# Patient Record
Sex: Male | Born: 1950 | Race: White | Hispanic: No | Marital: Married | Smoking: Never smoker
Health system: Southern US, Community
[De-identification: ages and names within clinical notes are randomized; demographics above are authoritative.]

## PROBLEM LIST (undated history)

## (undated) DIAGNOSIS — K219 Gastro-esophageal reflux disease without esophagitis: Secondary | ICD-10-CM

## (undated) DIAGNOSIS — M109 Gout, unspecified: Secondary | ICD-10-CM

## (undated) DIAGNOSIS — G459 Transient cerebral ischemic attack, unspecified: Secondary | ICD-10-CM

## (undated) DIAGNOSIS — E785 Hyperlipidemia, unspecified: Secondary | ICD-10-CM

## (undated) DIAGNOSIS — M1990 Unspecified osteoarthritis, unspecified site: Secondary | ICD-10-CM

## (undated) DIAGNOSIS — R3129 Other microscopic hematuria: Secondary | ICD-10-CM

## (undated) HISTORY — DX: Other microscopic hematuria: R31.29

## (undated) HISTORY — DX: Hyperlipidemia, unspecified: E78.5

## (undated) HISTORY — DX: Gastro-esophageal reflux disease without esophagitis: K21.9

## (undated) HISTORY — DX: Unspecified osteoarthritis, unspecified site: M19.90

## (undated) HISTORY — DX: Transient cerebral ischemic attack, unspecified: G45.9

---

## 1989-01-17 HISTORY — PX: OTHER SURGICAL HISTORY: SHX169

## 2006-11-28 ENCOUNTER — Encounter: Payer: Self-pay | Admitting: Family Medicine

## 2007-12-06 ENCOUNTER — Encounter: Payer: Self-pay | Admitting: Family Medicine

## 2007-12-09 ENCOUNTER — Encounter: Payer: Self-pay | Admitting: Family Medicine

## 2008-02-01 ENCOUNTER — Encounter: Payer: Self-pay | Admitting: Family Medicine

## 2008-10-08 ENCOUNTER — Ambulatory Visit: Payer: Self-pay | Admitting: Family Medicine

## 2008-10-08 DIAGNOSIS — T148 Other injury of unspecified body region: Secondary | ICD-10-CM

## 2008-10-08 DIAGNOSIS — W57XXXA Bitten or stung by nonvenomous insect and other nonvenomous arthropods, initial encounter: Secondary | ICD-10-CM | POA: Insufficient documentation

## 2009-05-20 ENCOUNTER — Ambulatory Visit: Payer: Self-pay | Admitting: Family Medicine

## 2009-05-20 DIAGNOSIS — L2089 Other atopic dermatitis: Secondary | ICD-10-CM

## 2009-05-20 DIAGNOSIS — R21 Rash and other nonspecific skin eruption: Secondary | ICD-10-CM

## 2009-06-25 ENCOUNTER — Ambulatory Visit: Payer: Self-pay | Admitting: Family Medicine

## 2009-06-25 DIAGNOSIS — M79671 Pain in right foot: Secondary | ICD-10-CM

## 2009-06-25 DIAGNOSIS — E785 Hyperlipidemia, unspecified: Secondary | ICD-10-CM

## 2009-06-26 LAB — CONVERTED CEMR LAB
Albumin: 4.4 g/dL (ref 3.5–5.2)
Basophils Absolute: 0 10*3/uL (ref 0.0–0.1)
Basophils Relative: 1 % (ref 0–1)
CO2: 26 meq/L (ref 19–32)
Chloride: 104 meq/L (ref 96–112)
Creatinine, Ser: 1.16 mg/dL (ref 0.40–1.50)
HDL: 46 mg/dL (ref >39)
Hemoglobin: 15.3 g/dL (ref 13.0–17.0)
LDL Cholesterol: 108 mg/dL — ABNORMAL HIGH (ref 0–99)
Lymphs Abs: 1.7 10*3/uL (ref 0.7–4.0)
MCHC: 33.6 g/dL (ref 30.0–36.0)
Monocytes Relative: 11 % (ref 3–12)
Neutro Abs: 3.8 10*3/uL (ref 1.7–7.7)
Neutrophils Relative %: 61 % (ref 43–77)
PSA: 0.91 ng/mL (ref 0.10–4.00)
Platelets: 203 10*3/uL (ref 150–400)
RDW: 13.1 % (ref 11.5–15.5)
Sodium: 143 meq/L (ref 135–145)
Total Bilirubin: 0.6 mg/dL (ref 0.3–1.2)
VLDL: 12 mg/dL (ref 0–40)

## 2009-08-21 ENCOUNTER — Ambulatory Visit: Payer: Self-pay | Admitting: Family Medicine

## 2009-08-22 ENCOUNTER — Encounter: Payer: Self-pay | Admitting: Family Medicine

## 2009-08-22 LAB — CONVERTED CEMR LAB
Basophils Absolute: 0 10*3/uL (ref 0.0–0.1)
Eosinophils Relative: 1 % (ref 0–5)
HCT: 44.1 % (ref 39.0–52.0)
Hemoglobin: 15.4 g/dL (ref 13.0–17.0)
MCV: 90.9 fL (ref 78.0–100.0)
Monocytes Absolute: 1 10*3/uL (ref 0.1–1.0)

## 2009-08-23 ENCOUNTER — Encounter: Payer: Self-pay | Admitting: Family Medicine

## 2009-11-08 ENCOUNTER — Encounter: Payer: Self-pay | Admitting: Family Medicine

## 2010-05-01 ENCOUNTER — Encounter: Payer: Self-pay | Admitting: Family Medicine

## 2010-05-01 ENCOUNTER — Ambulatory Visit: Payer: Self-pay | Admitting: Family Medicine

## 2010-05-01 ENCOUNTER — Encounter
Admission: RE | Admit: 2010-05-01 | Discharge: 2010-05-01 | Payer: Self-pay | Source: Home / Self Care | Attending: Family Medicine | Admitting: Family Medicine

## 2010-05-01 DIAGNOSIS — M109 Gout, unspecified: Secondary | ICD-10-CM | POA: Insufficient documentation

## 2010-05-02 LAB — CONVERTED CEMR LAB: Uric Acid, Serum: 9.1 mg/dL — ABNORMAL HIGH (ref 4.0–7.8)

## 2010-05-06 ENCOUNTER — Encounter: Payer: Self-pay | Admitting: Family Medicine

## 2010-05-23 ENCOUNTER — Ambulatory Visit
Admission: RE | Admit: 2010-05-23 | Discharge: 2010-05-23 | Payer: Self-pay | Source: Home / Self Care | Admitting: Family Medicine

## 2010-05-23 DIAGNOSIS — S93409A Sprain of unspecified ligament of unspecified ankle, initial encounter: Secondary | ICD-10-CM | POA: Insufficient documentation

## 2010-05-24 ENCOUNTER — Encounter: Payer: Self-pay | Admitting: Family Medicine

## 2010-05-27 ENCOUNTER — Ambulatory Visit
Admission: RE | Admit: 2010-05-27 | Discharge: 2010-05-27 | Payer: Self-pay | Source: Home / Self Care | Admitting: Emergency Medicine

## 2010-05-28 ENCOUNTER — Ambulatory Visit: Admit: 2010-05-28 | Payer: Self-pay | Admitting: Family Medicine

## 2010-05-29 ENCOUNTER — Telehealth (INDEPENDENT_AMBULATORY_CARE_PROVIDER_SITE_OTHER): Payer: Self-pay | Admitting: *Deleted

## 2010-06-14 ENCOUNTER — Ambulatory Visit
Admission: RE | Admit: 2010-06-14 | Discharge: 2010-06-14 | Payer: Self-pay | Source: Home / Self Care | Attending: Family Medicine | Admitting: Family Medicine

## 2010-06-18 NOTE — Assessment & Plan Note (Signed)
Summary: NOV CPE   Vital Signs:  Patient profile:   60 year old male Height:      71 inches Weight:      214 pounds BMI:     29.95 O2 Sat:      99 % on Room air Temp:     97.7 degrees F oral Pulse rate:   56 / minute BP sitting:   134 / 82  (left arm) Cuff size:   regular  Vitals Entered By: Payton Spark CMA (June 25, 2009 9:04 AM)  O2 Flow:  Room air CC: New to est. CPE. Fasting labs   Primary Care Provider:  Seymour Bars DO  CC:  New to est. CPE. Fasting labs.  History of Present Illness: 60 yo WM presents for NOV, CPE w/ fasting labs.  Previously seen at [facility].  He has hx of L elbow pain x 3 months but never sought medical care for this.  He also has had chronic R>L foot pain with a ? podagra.  He is due for fasting labs.  His Tetanus was updated 2 yrs ago.  Colonoscopy done in 2003, was normal.    He was put on prednisone which helped his R foot pain.    Current Medications (verified): 1)  Celebrex 200 Mg Caps (Celecoxib) .... Once A Day As Needed 2)  Bayer Aspirin Ec Low Dose 81 Mg Tbec (Aspirin) .... 2 Tablets A Day 3)  Omeprazole 20 Mg Cpdr (Omeprazole) .... 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  No kids. Never smoked. Denies ETOH. Walks 30 min/ day.  Review of Systems       no fevers/sweats/weakness, unexplained wt loss/gain, no change in vision, no difficulty hearing, ringing in ears, no hay fever/allergies, no CP/discomfort, no palpitations, no breast lump/nipple discharge, no cough/wheeze, no blood in stool,no N/V/D, no nocturia, no leaking urine, no unusual vag bleeding, no  vaginal/penile discharge, no muscle/joint pain, no rash, no new/changing mole, no HA, no memory loss, no anxiety, no sleep problem, no depression, no unexplained lumps, no easy bruising/bleeding, no concern with sexual function   Physical Exam  General:  alert, well-developed, well-nourished, and well-hydrated.   Head:  /AT Eyes:  wears glasses; PERRLA Ears:  EACs patent; TMs translucent and gray with good cone of light and bony landmarks.  Nose:  no nasal discharge.   Mouth:  good dentition and pharynx pink and moist.   Neck:  no masses.  no audible carotid bruits Lungs:  Normal respiratory effort, chest expands symmetrically. Lungs are clear to auscultation, no crackles or wheezes. Heart:  normal rate, regular rhythm, no murmur, and no gallop.   Abdomen:  Bowel sounds positive,abdomen soft and non-tender without masses, organomegaly, no AA bruits Rectal:  No external abnormalities noted. Normal sphincter tone. No rectal masses or tenderness. hemoccult neg Prostate:  Prostate gland firm and smooth, no enlargement, nodularity, tenderness, mass, asymmetry or induration. Msk:  full ROM both elbows with tenderness over lateral epicondyle, L w/o edema or effusion.  dropped longitudinal arch both feet with hammertoes and a mild bunion deformity R foot.    Pulses:  2+ radial  and pedal pulses Extremities:  no LE edema Skin:  xerotic skin with papular rash in patches on the lower legs Cervical Nodes:  No lymphadenopathy noted Psych:  good eye contact, not anxious appearing, and not depressed appearing.     Impression & Recommendations:  Problem # 1:  Preventive Health Care (ICD-V70.0) Keeping healthy checklist for men reviewed. BP at goal.  BMI overwt range.  He is working on [employer] and increased exercise. Update fasting labs today.  Include ESR, Uric Acid, PSA and CBC for rash and ? gout. DRE today.  Hemoccult today.  Both normal. Tetanus due in 8 yrs. Colonoscopy due  2013.  Complete Medication List: 1)  Celebrex 200 Mg Caps (Celecoxib) .... Once a day as needed 2)  Bayer Aspirin Ec Low Dose 81 Mg Tbec (Aspirin) .... 2 tablets a day 3)  Omeprazole 20 Mg Cpdr (Omeprazole) .... Take 1 tab by mouth once daily  Other Orders: T-Uric Acid (Blood) [id number]) T-Sed Rate (Automated) [id number]) T-PSA ([id number]) T-Comprehensive Metabolic Panel [id number]) T-Lipid Profile ([id number]) T-CBC w/Diff ([id number]) Hemoccult Guaiac-1 spec.(in office) (82270) DRE (I3474)  Patient Instructions: 1)  Fasting labs today. 2)  Will call you w/ results in the morning. 3)  Checking for vasculitis, gout, signs of autoimmune processes. 4)  You may need referral to podiatrist for chronic foot pain and sports medicine for L elbow pain.

## 2010-06-18 NOTE — Assessment & Plan Note (Signed)
Summary: Rash - lower back/top of buttocks x 1-2 weeks rm 3   Vital Signs:  Patient Profile:   60 Years Old Male CC:      Rash - top of Buttocks x x 1wk Height:     72.25 inches Weight:      215 pounds O2 Sat:      100 % O2 treatment:    Room Air Temp:     97.3 degrees F oral Pulse rate:   63 / minute Pulse rhythm:   regular Resp:     16 per minute BP sitting:   129 / 73  (right arm) Cuff size:   regular  Vitals Entered By: Areta Haber CMA (May 20, 2009 1:56 PM)                  Prior Medication List:  PREDNISONE 5 MG TABS (PREDNISONE) once a day CELEBREX 200 MG CAPS (CELECOXIB) once a day as needed * PRILOSEC once a day BAYER ASPIRIN EC LOW DOSE 81 MG TBEC (ASPIRIN) 2 tablets a day   Current Allergies (reviewed today): No known allergies History of Present Illness Chief Complaint: Rash - top of Buttocks x x 1wk History of Present Illness: Rash over the top of the buttocks. Off and on for about a year but this time the rash has seen to have gotten worse. For 1-2 week this currentrash has itch and burn. Neither the lamoisil or hydrocortisone cream has seen to help.  Current Problems: RASH AND OTHER NONSPECIFIC SKIN ERUPTION (ICD-782.1) ECZEMA, ATOPIC (ICD-691.8) INSECT BITE (ICD-919.4)   Current Meds PREDNISONE 5 MG TABS (PREDNISONE) once a day CELEBREX 200 MG CAPS (CELECOXIB) once a day as needed * PRILOSEC once a day BAYER ASPIRIN EC LOW DOSE 81 MG TBEC (ASPIRIN) 2 tablets a day ELOCON 0.1 %  CREA (MOMETASONE FUROATE) apply to area 2x a day  REVIEW OF SYSTEMS Constitutional Symptoms      Denies fever, chills, and night sweats.  Eyes       Denies change in vision, eye pain, and eye discharge. Ear/Nose/Throat/Mouth       Denies hearing loss/aids, change in hearing, and ear pain.  Respiratory       Denies dry cough, productive cough, asthma, and bronchitis.  Cardiovascular       Denies murmurs and chest pain.    Gastrointestinal       Denies  stomach pain. Neurological       Denies paralysis and seizures. Musculoskeletal       Complains of joint stiffness.      Denies muscle pain, joint pain, decreased range of motion, redness, and swelling.      Comments: chronic arthritis Skin       Comments: Lower Back/top of buttocks x 1-2 weeks Psych       Denies mood changes, temper/anger issues, anxiety/stress, speech problems, and depression. Other Comments: Pt states that this rash has been off and on for 2+ months. Pt states that he has not seen his PCP for this.   Past History:  Past Medical History: Last updated: 10/08/2008 arthritis  Past Surgical History: Last updated: 10/08/2008 Denies surgical history  Family History: Last updated: 10/08/2008 mother alive age 52 and healthy father deceased age 67 heart attack sister alive age 33 and healthy  Social History: Last updated: 10/08/2008 denies drinking, smoking or recreational drug use  Family History: Reviewed history from 10/08/2008 and no changes required. mother alive age 65 and healthy father deceased  age 21 heart attack sister alive age 42 and healthy  Social History: Reviewed history from 10/08/2008 and no changes required. denies drinking, smoking or recreational drug use Physical Exam General appearance: well developed, well nourished, no acute distress Head: normocephalic, atraumatic Skin: eczema type rash upper cleavege of buttocks  MSE: oriented to time, place, and person Assessment New Problems: RASH AND OTHER NONSPECIFIC SKIN ERUPTION (ICD-782.1) ECZEMA, ATOPIC (ICD-691.8)  excema  Plan New Medications/Changes: ELOCON 0.1 %  CREA (MOMETASONE FUROATE) apply to area 2x a day  #45-60 gram x 1, 05/20/2009, Hassan Rowan MD  New Orders: New Patient Level III [phone] Planning Comments:   if not improved in 2 weeks please see dermatologisyt   The patient and/or caregiver has been counseled thoroughly with regard to medications prescribed  including dosage, schedule, interactions, rationale for use, and possible side effects and they verbalize understanding.  Diagnoses and expected course of recovery discussed and will return if not improved as expected or if the condition worsens. Patient and/or caregiver verbalized understanding.  Prescriptions: ELOCON 0.1 %  CREA (MOMETASONE FUROATE) apply to area 2x a day  #45-60 gram x 1   Entered and Authorized by:   Hassan Rowan MD   Signed by:   Hassan Rowan MD on 05/20/2009   Method used:   Printed then faxed to ...       [facility] [address] (retail)       [address]       [place], [place]  [address]       Ph: [phone]       Fax: [phone]   RxID:   [id number]   Patient Instructions: 1)  Please schedule a follow-up appointment as needed. 2)  Please schedule an appointment with Dermatologist in 2 weeks.

## 2010-06-18 NOTE — Letter (Signed)
Summary: [facility]   Imported By: Lanelle Bal 09/03/2009 08:50:25  _____________________________________________________________________  External Attachment:    Type:   Image     Comment:   External Document

## 2010-06-18 NOTE — Letter (Signed)
Summary: [facility]  [facility]   Imported By: Lanelle Bal 09/03/2009 08:52:17  _____________________________________________________________________  External Attachment:    Type:   Image     Comment:   External Document

## 2010-06-18 NOTE — Consult Note (Signed)
Summary: [facility]   Imported By: Lanelle Bal 08/28/2009 12:28:14  _____________________________________________________________________  External Attachment:    Type:   Image     Comment:   External Document

## 2010-06-18 NOTE — Miscellaneous (Signed)
Summary: old records  Clinical Lists Changes  Observations: Added new observation of PAST MED HX: arthritis GERD hx of TIA high cholesterol GERD L spine arthritis hx of microscopic hematuria  ETT 1997 (11/08/2009 8:21) Added new observation of PRIMARY MD: Seymour Bars DO (11/08/2009 8:21)       Past History:  Past Medical History: arthritis GERD hx of TIA high cholesterol GERD L spine arthritis hx of microscopic hematuria  ETT 1997

## 2010-06-18 NOTE — Assessment & Plan Note (Signed)
Summary: septic knee   Vital Signs:  Patient profile:   60 year old male Height:      71 inches Weight:      210 pounds BMI:     29.39 O2 Sat:      99 % on Room air Temp:     97.8 degrees F oral Pulse rate:   59 / minute BP sitting:   150 / 85  (left arm) Cuff size:   regular  Vitals Entered By: Payton Spark CMA (August 21, 2009 1:15 PM)  O2 Flow:  Room air CC: R knee red and swollen. ? infected.    Primary Care Provider:  Seymour Bars DO  CC:  R knee red and swollen. ? infected. Marland Kitchen  History of Present Illness: 60 yo WM presents for R knee redness x 1 day with soreness.  Has an abrasion over the patella for the past 5 days.  He has been taking Celebrex.  He has been doing labor -- knealing more often.  He has also noticed swelling that started yesterday.    He has not had a fever.  He has rigors and chills last night.  He does have a hx of gout.  He has maintained full ROM and is able to bear weight and walk on the R leg.  Current Medications (verified): 1)  Celebrex 200 Mg Caps (Celecoxib) .... Once A Day As Needed 2)  Bayer Aspirin Ec Low Dose 81 Mg Tbec (Aspirin) .... 2 Tablets A Day 3)  Omeprazole 20 Mg Cpdr (Omeprazole) .... Take 1 Tab By Mouth Once Daily  Allergies (verified): No Known Drug Allergies  Past History:  Past Medical History: Reviewed history from 06/25/2009 and no changes required. 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 Denies ETOH. Walks 30 min/ day.  Review of Systems General:  Complains of chills.  Physical Exam  General:  alert, well-developed, well-nourished, and well-hydrated.   Lungs:  Normal respiratory effort, chest expands symmetrically. Lungs are clear to auscultation, no crackles or wheezes. Heart:  Normal rate and regular rhythm. S1 and S2 normal without gallop, murmur, click, rub or other extra  sounds. Msk:  full R knee active ROM R knee effusion Skin:  color normal.  warm red R knee from superior pole of the patella to the patellar tendon with streaking redness down lateral R lower leg healing abrasion over the patella Psych:  good eye contact, not anxious appearing, and not depressed appearing.     Impression & Recommendations:  Problem # 1:  ARTHRITIS, SEPTIC (ICD-711.00)  Sent to ortho downstairs now for arthrocentesis --> send for cx and crystals. Will need empiric abx started.  Can stay on Celebrex.   Check labs today also.  Orders: T-CBC w/Diff ([id number]) T-Uric Acid (Blood) [id number])  Complete Medication List: 1)  Celebrex 200 Mg Caps (Celecoxib) .... Once a day as needed 2)  Bayer Aspirin Ec Low Dose 81 Mg Tbec (Aspirin) .... 2 tablets a day 3)  Omeprazole 20 Mg Cpdr (Omeprazole) .... Take 1 tab by mouth once daily  Patient Instructions: 1)  See Dr Jorge Mandril downstairs for septic arthrits.   2)  Need fluid sample from R knee along with empiric antibiotics.

## 2010-06-20 NOTE — Letter (Signed)
Summary: Internal Correspondence  Internal Correspondence   Imported By: Dannette Barbara 05/24/2010 12:41:23  _____________________________________________________________________  External Attachment:    Type:   Image     Comment:   External Document

## 2010-06-20 NOTE — Assessment & Plan Note (Signed)
Summary: INFLAMMATION OF FOOT (L) room 4   Vital Signs:  Patient Profile:   60 Years Old Male CC:      inflammation in LT foot Height:     71 inches Weight:      218 pounds O2 Sat:      97 % O2 treatment:    Room Air Temp:     97.5 degrees F oral Pulse rate:   93 / minute Resp:     20 per minute BP sitting:   133 / 88  (left arm) Cuff size:   large  Vitals Entered By: Clemens Catholic LPN (May 27, 2010 1:31 PM)                  Updated Prior Medication List: CELEBREX 200 MG CAPS (CELECOXIB) once a day as needed BAYER ASPIRIN EC LOW DOSE 81 MG TBEC (ASPIRIN) 2 tablets a day OMEPRAZOLE 20 MG CPDR (OMEPRAZOLE) Take 1 tab by mouth once daily ALLOPURINOL 100 MG TABS (ALLOPURINOL) 2 tabs by mouth once daily  Current Allergies (reviewed today): No known allergies History of Present Illness History from: patient Chief Complaint: inflammation in LT foot History of Present Illness: Patient recently here for L ankle sprain, that is feeling mostly better.  However now in the past few days has increased redness, swelling, itching, and pain in lateral foot and great toe of the L foot.  He has a history of gout in his R foot and R knee and has an elevated uric acid level.  He feels this is gout.  He is taking Celebrex and also has leftover Colchecine that helps.  He has not taken his Allopurinol dose today and has been on it for 1 month.  No F/C/N/V.    REVIEW OF SYSTEMS Constitutional Symptoms      Denies fever, chills, night sweats, weight loss, weight gain, and fatigue.  Eyes       Denies change in vision, eye pain, eye discharge, glasses, contact lenses, and eye surgery. Ear/Nose/Throat/Mouth       Denies hearing loss/aids, change in hearing, ear pain, ear discharge, dizziness, frequent runny nose, frequent nose bleeds, sinus problems, sore throat, hoarseness, and tooth pain or bleeding.  Respiratory       Denies dry cough, productive cough, wheezing, shortness of breath,  asthma, bronchitis, and emphysema/COPD.  Cardiovascular       Denies murmurs, chest pain, and tires easily with exhertion.    Gastrointestinal       Denies stomach pain, nausea/vomiting, diarrhea, constipation, blood in bowel movements, and indigestion. Genitourniary       Denies painful urination, kidney stones, and loss of urinary control. Neurological       Denies paralysis, seizures, and fainting/blackouts. Musculoskeletal       Complains of joint stiffness, decreased range of motion, redness, and swelling.      Denies muscle pain, joint pain, muscle weakness, and gout.  Skin       Denies bruising, unusual mles/lumps or sores, and hair/skin or nail changes.  Psych       Denies mood changes, temper/anger issues, anxiety/stress, speech problems, depression, and sleep problems. Other Comments: pt is here today for inflammation in his LT foot. he sprained his ankle and now believes his gout is flaring up. red, swollen, sensative to touch. he is taking allopurinol and Celebrex.   Past History:  Past Medical History: Reviewed history from 11/08/2009 and no changes required. arthritis GERD hx of TIA high cholesterol  GERD L spine arthritis hx of microscopic hematuria  ETT 1997  Past Surgical History: Reviewed history from 06/25/2009 and no changes required. Lipoma removed under jaw in the 1990s  Family History: Reviewed history from 06/25/2009 and no changes required. 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 Denies ETOH. Walks 30 min/ day. Physical Exam General appearance: well developed, well nourished, antalgic gait and mod distress Neurological: distal NV status intact Skin: see below MSE: oriented to time, place, and person L foot/ankle: FROM, full  strength, resisted motions not painful.  No TTP medial/lateral malleolus, navicular, base of 5th, calcaneus, Achilles, or proximal fibula. +TTP 1st MTP and along 4th and 5th MT.  Mild TTP ATFL.  + swelling entire foot with 1+ pitting edema.  No ecchymoses. +warmth  Patient Education: Patient and/or caregiver instructed in the following: rest, fluids.  Plan New Medications/Changes: PREDNISONE (PAK) 10 MG TABS (PREDNISONE) 12 day pack, use as directed  #1 x 0, 05/27/2010, Hoyt Koch MD KEFLEX 500 MG CAPS (CEPHALEXIN) three times a day for 7 days  #21 x 0, 05/27/2010, Hoyt Koch MD  New Orders: Est. Patient Level III [phone] Planning Comments:   L ankle sprain last week, now currently with a probable gout flare.  Will give Rx for Pred 12 day pack.  Also gave Rx for Keflex 500 three times a day for 7 days to hold since I can't completely rule out septic process (although I doubt).  Take if fever or not improving with prednisone.  Encourage ice, elevation, compression stocking, and low-purine diet.  Hold on Allopurinol until flare is subsiding.  May continue Celebrex once daily. Caution with stomach upset.  If not improving, follow up with PCP, rheum, or ortho foot.    The patient and/or caregiver has been counseled thoroughly with regard to medications prescribed including dosage, schedule, interactions, rationale for use, and possible side effects and they verbalize understanding.  Diagnoses and expected course of recovery discussed and will return if not improved as expected or if the condition worsens. Patient and/or caregiver verbalized understanding.  Prescriptions: PREDNISONE (PAK) 10 MG TABS (PREDNISONE) 12 day pack, use as directed  #1 x 0   Entered and Authorized by:   Hoyt Koch MD   Signed by:   Hoyt Koch MD on 05/27/2010   Method used:   Handwritten   RxID:   [id number] KEFLEX 500 MG CAPS (CEPHALEXIN) three times a day for 7 days  #21 x 0   Entered  and Authorized by:   Hoyt Koch MD   Signed by:   Hoyt Koch MD on 05/27/2010   Method used:   Handwritten   RxID:   [id number]   Orders Added: 1)  Est. Patient Level III [53664]

## 2010-06-20 NOTE — Assessment & Plan Note (Signed)
Summary: ankle f/u   Vital Signs:  Patient profile:   60 year old male Height:      71 inches Weight:      217 pounds BMI:     30.37 O2 Sat:      99 % on Room air Pulse rate:   69 / minute BP sitting:   114 / 81  (left arm) Cuff size:   large  Vitals Entered By: Payton Spark CMA (June 14, 2010 8:25 AM)  O2 Flow:  Room air   Primary Care Provider:  Seymour Bars DO   History of Present Illness: 60 yo WM presents for an ankle sprain in the L ankle on 1-3.  He was seen at [facility] for pain and swelling.  He did take some prednisone which did hlep.  He tried to start walking again but had soreness the following day.  He was wrapping it at the onset but not anymore and still has some mild swelling over the lateral malleolus.    He would like to resume exercise but still has some pain.    Current Medications (verified): 1)  Celebrex 200 Mg Caps (Celecoxib) .... Once A Day As Needed 2)  Bayer Aspirin Ec Low Dose 81 Mg Tbec (Aspirin) .... 2 Tablets A Day 3)  Omeprazole 20 Mg Cpdr (Omeprazole) .... Take 1 Tab By Mouth Once Daily 4)  Allopurinol 100 Mg Tabs (Allopurinol) .... 2 Tabs By Mouth Once Daily  Allergies (verified): No Known Drug Allergies  Past History:  Past Medical History: Reviewed history from 11/08/2009 and no changes required. 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 Walks 30 min/ day.  Review of Systems      See HPI  Physical Exam  General:  alert, well-developed, well-nourished, and well-hydrated.   Msk:  mild edema localzed to the lateral L malleoulus.  no bruising or redness.  neg ant drawer test and talar tilt test.  point tender over the posterior talofibular ligament.   Pulses:  2+ pedal pulses Extremities:  no LE  edema Neurologic:  gait normal, sensation intact to light touch.     Impression & Recommendations:  Problem # 1:  ANKLE SPRAIN, LEFT (ICD-845.00) Improving.  Likely had a partial tear of the PTF ligament which may take 2 more weeks to heal.  advised use of elastic brace while exercising and slowly returning over the next 2 wks.  Ice for 10 min after exercise.  NSAIDs as needed.  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 His updated medication list for this problem includes:    Celebrex 200 Mg Caps (Celecoxib) ..... Once a day as needed    Bayer Aspirin Ec Low Dose 81 Mg Tbec (Aspirin) .Marland Kitchen... 2 tablets a day  Complete Medication List: 1)  Celebrex 200 Mg Caps (Celecoxib) .... Once a day as needed 2)  Bayer Aspirin Ec Low Dose 81 Mg Tbec (Aspirin) .... 2 tablets a day 3)  Omeprazole 20 Mg Cpdr (Omeprazole) .... Take 1 tab by mouth once daily 4)  Allopurinol 100 Mg Tabs (Allopurinol) .... 2 tabs by mouth once daily  Other Orders: T-Comprehensive Metabolic Panel [id number]) T-Lipid Profile [id number]) T-PSA ([id number]) T-Uric Acid (Blood) ([id number])  Patient Instructions: 1)  Update fasting after 2/7 downstairs. 2)  Schedule your PHYSICAL in  6 wks. 3)  Use elastic ankle brace with exercise for the next 2 wks. 4)  Ice ankle after walking x 10 min. 5)  Call if you want to add PT.   Orders Added: 1)  T-Comprehensive Metabolic Panel [[id number]] 2)  T-Lipid Profile [[id number]] 3)  T-PSA [[id number]] 4)  T-Uric Acid (Blood) [[id number]] 5)  Est. Patient Level III [09811]

## 2010-06-20 NOTE — Assessment & Plan Note (Signed)
Summary: gout/ elbow pain   Vital Signs:  Patient profile:   60 year old male Height:      71 inches Weight:      218 pounds Pulse rate:   60 / minute BP sitting:   127 / 85  (right arm) Cuff size:   regular  Vitals Entered By: Avon Gully CMA, Duncan Dull) (May 01, 2010 1:30 PM) CC: left elbow pain on and off x several years   Primary Care Provider:  Seymour Bars DO  CC:  left elbow pain on and off x several years.  History of Present Illness: 60 yo WM presents for L elbow pain and off for years.  He has a hx of gout and went to the hospital for gout in his knee this year with secondary cellulitis.  Has mild swelling over the L olecranon but no redness or heat.   This did start to get worse after he started working out a few wks ago and has the most pain with bench pressing.  Denies prevoius injury or any pain in the R elbow.  Has some mild resting pain.  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    Current Medications (verified): 1)  Celebrex 200 Mg Caps (Celecoxib) .... Once A Day As Needed 2)  Bayer Aspirin Ec Low Dose 81 Mg Tbec (Aspirin) .... 2 Tablets A Day 3)  Omeprazole 20 Mg Cpdr (Omeprazole) .... Take 1 Tab By Mouth Once Daily  Allergies (verified): No Known Drug Allergies  Comments:  Nurse/Medical Assistant: The patient's medications and allergies were reviewed with the patient and were updated in the Medication and Allergy Lists. Avon Gully CMA, Duncan Dull) (May 01, 2010 1:31 PM)  Past History:  Past Medical History: Reviewed history from 11/08/2009 and no changes required. 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   No kids. Never smoked. Denies ETOH. Walks 30 min/ day.  Review of Systems      See HPI  Physical Exam  General:  alert, well-developed, well-nourished, and well-hydrated.   Msk:  mild inflammation over the L olecranon bursa w/o redness or heat.  full active L elbow ROM with no resisting pain.   Pulses:  2+ radial pulses Extremities:  no hand/ leg edmea Skin:  color normal.   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  Will get an Xray and a uric acid level today.  Continue daily Celebrex.  Use of OTC brace has not helped.  Can use ice 15 min 3 x a day for pain.  Plan to start Allopurinol to get Uric Acid < 6 if indicated and offered a f/u with Dr Jorge Mandril for injection. Orders: T-DG Elbow 2 Views*L* ([address])  Problem # 2:  GOUT, UNSPECIFIED (ICD-274.9) as per #1. His updated medication list for this problem includes:    Celebrex 200 Mg Caps (Celecoxib) ..... Once a day as needed  Orders: T-Uric Acid (Blood) ([id number])  Complete Medication List: 1)  Celebrex 200 Mg Caps (Celecoxib) .... Once a day as needed 2)  Bayer Aspirin Ec Low Dose  81 Mg Tbec (Aspirin) .... 2 tablets a day 3)  Omeprazole 20 Mg Cpdr (Omeprazole) .... Take 1 tab by mouth once daily  Patient Instructions: 1)  Stay on Prilosec 20 mg daily + Celebrex 200 mg daily. 2)  Check uric Acid level today. 3)  Start Allopurinol if > 6. 4)  L elbow Xray today. 5)  Consider injection with Dr Jorge Mandril downstairs. Prescriptions: CELEBREX 200 MG CAPS (CELECOXIB) once a day as needed  #90 x 1   Entered and Authorized by:   Seymour Bars DO   Signed by:   Seymour Bars DO on 05/01/2010   Method used:   Printed then faxed to ...       [facility] (mail-order)       [address] [place], [place]  [address]       Ph: [phone]       Fax: [phone]   RxID:   [id number] OMEPRAZOLE 20 MG CPDR  (OMEPRAZOLE) Take 1 tab by mouth once daily  #90 x 3   Entered and Authorized by:   Seymour Bars DO   Signed by:   Seymour Bars DO on 05/01/2010   Method used:   Printed then faxed to ...       [facility] (mail-order)       [address] [place], [place]  [address]       Ph: [phone]       Fax: [phone]   RxID:   [id number]    Orders Added: 1)  T-DG Elbow 2 Views*L* [73070] 2)  T-Uric Acid (Blood) [84550-23180] 3)  Est. Patient Level III [88416]

## 2010-06-20 NOTE — Medication Information (Signed)
Summary: Fax Regarding NSAID Induced Ulcers/[facility]  Fax Regarding NSAID Induced Ulcers/[facility]   Imported By: Lanelle Bal 05/17/2010 11:51:27  _____________________________________________________________________  External Attachment:    Type:   Image     Comment:   External Document

## 2010-06-20 NOTE — Assessment & Plan Note (Signed)
Summary: LEFT ANKLE PAIN AND SWELLING (rm3)   Vital Signs:  Patient Profile:   60 Years Old Male CC:      left ankle pain x 2 days Height:     71 inches Weight:      220 pounds O2 Sat:      99 % O2 treatment:    Room Air Temp:     97.6 degrees F oral Pulse rate:   69 / minute Resp:     14 per minute BP sitting:   134 / 84  (left arm) Cuff size:   large  Pt. in pain?   yes    Location:   left ankle    Intensity:   4    Type:       aching  Vitals Entered By: Lajean Saver RN (May 23, 2010 5:17 PM)                   Updated Prior Medication List: CELEBREX 200 MG CAPS (CELECOXIB) once a day as needed BAYER ASPIRIN EC LOW DOSE 81 MG TBEC (ASPIRIN) 2 tablets a day OMEPRAZOLE 20 MG CPDR (OMEPRAZOLE) Take 1 tab by mouth once daily ALLOPURINOL 100 MG TABS (ALLOPURINOL) 2 tabs by mouth once daily  Current Allergies: No known allergies History of Present Illness Chief Complaint: left ankle pain x 2 days History of Present Illness:  Subjective:  Patient complains of inverting his left ankle two days ago.  He had immediate swelling and pain laterally that has persisted.  Has pain with ambulation  REVIEW OF SYSTEMS Constitutional Symptoms      Denies fever, chills, night sweats, weight loss, weight gain, and fatigue.  Eyes       Denies change in vision, eye pain, eye discharge, glasses, contact lenses, and eye surgery. Ear/Nose/Throat/Mouth       Denies hearing loss/aids, change in hearing, ear pain, ear discharge, dizziness, frequent runny nose, frequent nose bleeds, sinus problems, sore throat, hoarseness, and tooth pain or bleeding.  Respiratory       Denies dry cough, productive cough, wheezing, shortness of breath, asthma, bronchitis, and emphysema/COPD.  Cardiovascular       Denies murmurs, chest pain, and tires easily with exhertion.    Gastrointestinal       Denies stomach pain, nausea/vomiting, diarrhea, constipation, blood in bowel movements, and  indigestion. Genitourniary       Denies painful urination, kidney stones, and loss of urinary control. Neurological       Denies paralysis, seizures, and fainting/blackouts. Musculoskeletal       Complains of muscle pain, joint pain, decreased range of motion, redness, swelling, and gout.      Denies joint stiffness and muscle weakness.      Comments: left ankle Skin       Denies bruising, unusual mles/lumps or sores, and hair/skin or nail changes.  Psych       Denies mood changes, temper/anger issues, anxiety/stress, speech problems, depression, and sleep problems. Other Comments: Patient rolled his left ankle 2 days ago when stepping off of a curb. Swelling and bruising are present. The swelling is extended into his lower leg   Past History:  Past Medical History: Reviewed history from 11/08/2009 and no changes required. arthritis GERD hx of TIA high cholesterol GERD L spine arthritis hx of microscopic hematuria  ETT 1997  Past Surgical History: Reviewed history from 06/25/2009 and no changes required. Lipoma removed under jaw in the 1990s  Family History: Reviewed history  from 06/25/2009 and no changes required. 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 Denies ETOH. Walks 30 min/ day.   Objective:  Appearance:  Patient appears healthy, stated age, and in no acute distress  Left ankle:  Decreased range of motion.  Tenderness and swelling over both medial and lateral malleoli.  Joint stable.  No tenderness over the base of the fifth  metatarsal.  Distal neurovascular intact.  Left ankle X-ray:  No acute osseous abnormality.  Bilateral malleolar soft tissue swelling could indicate ligamentous injury. Assessment New Problems: ANKLE SPRAIN, LEFT  (ICD-845.00)   Plan New Orders: T-DG Ankle Complete*L* [73610] Ace Wraps 3-5 in/yard  [A6449] Crutches [E0110] Crutches fitting and training [97760] Aircast Ankle Brace [L4350] Est. Patient Level III [[address]] Planning Comments:     Apply ice pack for 30 minutes every 1 to 2 hours today and tomorrow.  Elevate.  Use crutches for 3 to 5 days.  Wear Ace wrap until swelling decreases.  Wear brace for about 2 to 3 weeks.  Begin exercises in about 5 days as per instruction sheet ([employer] information and instruction patient handout given)   Continue Celebrex Follow-up with sports med clinic if not improving 2 weeks.   The patient and/or caregiver has been counseled thoroughly with regard to medications prescribed including dosage, schedule, interactions, rationale for use, and possible side effects and they verbalize understanding.  Diagnoses and expected course of recovery discussed and will return if not improved as expected or if the condition worsens. Patient and/or caregiver verbalized understanding.   Orders Added: 1)  T-DG Ankle Complete*L* [73610] 2)  Ace Wraps 3-5 in/yard  [A6449] 3)  Crutches [E0110] 4)  Crutches fitting and training [97760] 5)  Aircast Ankle Brace [L4350] 6)  Est. Patient Level III [60[address]]

## 2010-06-20 NOTE — Progress Notes (Signed)
  Phone Note Outgoing Call Call back at [facility] Phone [phone]   Call placed by: Lajean Saver RN,  May 29, 2010 2:40 PM Call placed to: Patient Summary of Call: Callback: No answer. Message left with reason for call and call back with any questions or concerns

## 2010-07-02 ENCOUNTER — Encounter: Payer: Self-pay | Admitting: Family Medicine

## 2010-07-05 ENCOUNTER — Encounter: Payer: Self-pay | Admitting: Family Medicine

## 2010-07-08 LAB — CONVERTED CEMR LAB
Albumin: 4.4 g/dL (ref 3.5–5.2)
CO2: 27 meq/L (ref 19–32)
Cholesterol: 230 mg/dL — ABNORMAL HIGH (ref 0–200)
Glucose, Bld: 93 mg/dL (ref 70–99)
HDL: 44 mg/dL (ref >39)
LDL Cholesterol: 167 mg/dL — ABNORMAL HIGH (ref 0–99)
Sodium: 138 meq/L (ref 135–145)
Uric Acid, Serum: 7.7 mg/dL (ref 4.0–7.8)
VLDL: 19 mg/dL (ref 0–40)

## 2010-07-29 ENCOUNTER — Encounter (INDEPENDENT_AMBULATORY_CARE_PROVIDER_SITE_OTHER): Payer: BC Managed Care – PPO | Admitting: Family Medicine

## 2010-07-29 ENCOUNTER — Encounter: Payer: Self-pay | Admitting: Family Medicine

## 2010-07-29 DIAGNOSIS — M25529 Pain in unspecified elbow: Secondary | ICD-10-CM | POA: Insufficient documentation

## 2010-07-29 DIAGNOSIS — Z0000 Encounter for general adult medical examination without abnormal findings: Secondary | ICD-10-CM

## 2010-08-06 NOTE — Assessment & Plan Note (Signed)
Summary: CPE   Vital Signs:  Patient profile:   60 year old male Height:      71 inches Weight:      217 pounds BMI:     30.37 O2 Sat:      100 % on Room air Pulse rate:   61 / minute BP sitting:   142 / 88  (left arm) Cuff size:   large  Vitals Entered By: Payton Spark CMA (July 29, 2010 3:04 PM)  O2 Flow:  Room air CC: CPE   Primary Care Provider:  Seymour Bars DO  CC:  CPE.  History of Present Illness: 60 yo WM presents for CPE.  He is a previously healthy male, married with no children.  Hx of high cholesterol, recently started on cRestor and Gout - doing well on allopurinol but has a hard time remembering it everyday.  He continues to have L elbow pain x 3 months that is limiting his ability to work out.  Had a normal xrays 3 mos ago and has used a brace and celebrex and has used relative rest but nothing has helped.  He just had labs done in Feb. His colonoscopy was done 7-8 yrs ago. PSA was normal last month with fasting labs.  Denies fam hx of premature heart dz or problems with chest pain or DOE.  Current Medications (verified): 1)  Celebrex 200 Mg Caps (Celecoxib) .... Once A Day As Needed 2)  Bayer Aspirin Ec Low Dose 81 Mg Tbec (Aspirin) .... 2 Tablets A Day 3)  Omeprazole 20 Mg Cpdr (Omeprazole) .... Take 1 Tab By Mouth Once Daily 4)  Allopurinol 100 Mg Tabs (Allopurinol) .... 2 Tabs By Mouth Once Daily 5)  Crestor 10 Mg Tabs (Rosuvastatin Calcium) .Marland Kitchen.. 1 Tab By Mouth At Bedtime  Allergies (verified): No Known Drug Allergies  Past History:  Past Medical History: Reviewed history from 11/08/2009 and no changes required. arthritis GERD hx of TIA high cholesterol GERD L spine arthritis hx of microscopic hematuria  ETT 1997  Family History: Reviewed history from 06/25/2009 and no changes required. 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 Denies ETOH. Walks 30 min/ day.  Review of Systems  The patient denies anorexia, fever, weight loss, weight gain, vision loss, decreased hearing, hoarseness, chest pain, syncope, dyspnea on exertion, peripheral edema, prolonged cough, headaches, hemoptysis, abdominal pain, melena, hematochezia, severe indigestion/heartburn, hematuria, incontinence, genital sores, muscle weakness, suspicious skin lesions, transient blindness, difficulty walking, depression, unusual weight change, abnormal bleeding, enlarged lymph nodes, angioedema, breast masses, and testicular masses.    Physical Exam  General:  alert, well-developed, well-nourished, and well-hydrated.   Head:  normocephalic, atraumatic, and male-pattern balding.   Eyes:  pupils equal, pupils round, and pupils reactive to light.   Ears:  EACs patent; TMs translucent and gray with good cone of light and bony landmarks.  Nose:  no nasal discharge.   Mouth:  good dentition and pharynx pink and moist.   Neck:  supple and no masses.  no audible carotid bruits Lungs:  Normal respiratory effort, chest expands symmetrically. Lungs are clear to auscultation, no crackles or wheezes. Heart:  Normal rate and regular rhythm. S1 and S2 normal without gallop, murmur, click, rub or other extra sounds. Abdomen:  soft, non-tender, normal bowel sounds,  no distention, no masses, no guarding, no rigidity, no hepatomegaly, and no splenomegaly.  no AA bruits Rectal:  hemoccult neg, no external abnormalities.   Prostate:  Prostate gland firm and smooth, 1+ enlargement, nodularity, tenderness, mass, asymmetry or induration. Msk:  tender over medial L olecranonon with full rOM Pulses:  2+ radial and pedal pulses Extremities:  no UE or LE edema Neurologic:  gait normal.   Skin:  color normal.  fair skinned Cervical Nodes:  No lymphadenopathy  noted Psych:  good eye contact, not anxious appearing, and not depressed appearing.     Impression & Recommendations:  Problem # 1:  HEALTH MAINTENANCE EXAM (ICD-V70.0) Keeping healthy checklist for men reviewed. BP high today, previously at goal.\ BMI 30 = class I obesity. Ortho referral made for ongoing L elbow pain. Labs updated 06-2010.  Immunizations UTD. Recheck cholesterol/ uric acid in 1 month. Colonoscopy due in 1-2 yrs. DRE today; PSA with labs. Continue to work on [employer], regular exercise, MVI and ASA 81 m/g day.  Complete Medication List: 1)  Celebrex 200 Mg Caps (Celecoxib) .... Once a day as needed 2)  Bayer Aspirin Ec Low Dose 81 Mg Tbec (Aspirin) .... 2 tablets a day 3)  Omeprazole 20 Mg Cpdr (Omeprazole) .... Take 1 tab by mouth once daily 4)  Allopurinol 100 Mg Tabs (Allopurinol) .... 2 tabs by mouth once daily 5)  Crestor 10 Mg Tabs (Rosuvastatin calcium) .Marland Kitchen.. 1 tab by mouth at bedtime  Other Orders: Orthopedic Surgeon Referral (Ortho Surgeon) T-LDL Direct [id number]) T-Liver Profile [phone]) T-Uric Acid (Blood) [phone])  Patient Instructions: 1)  Will get you in with Dr August Saucer for L elbow pain, possible partial tendon rupture. 2)  Stay on crestor and update labs in 1 month-- order printed.  can be done nonfasting. 3)  REturn for f/u as needed or in 1 yr for next physical. Prescriptions: ALLOPURINOL 100 MG TABS (ALLOPURINOL) 2 tabs by mouth once daily  #60 x 3   Entered and Authorized by:   Seymour Bars DO   Signed by:   Seymour Bars DO on 07/29/2010   Method used:   Print then Give to Patient   RxID:   [id number]    Orders Added: 1)  Orthopedic Surgeon Referral [Ortho Surgeon] 2)  T-LDL Direct [[id number]] 3)  T-Liver Profile [[id number]] 4)  T-Uric Acid (Blood) [[id number]] 5)  Est. Patient age 12-64 [phone]

## 2010-08-07 ENCOUNTER — Encounter: Payer: Self-pay | Admitting: Family Medicine

## 2010-08-08 ENCOUNTER — Ambulatory Visit (INDEPENDENT_AMBULATORY_CARE_PROVIDER_SITE_OTHER): Payer: BC Managed Care – PPO | Admitting: Family Medicine

## 2010-08-08 VITALS — BP 124/83 | HR 61 | Ht 72.0 in | Wt 219.0 lb

## 2010-08-08 DIAGNOSIS — M79671 Pain in right foot: Secondary | ICD-10-CM

## 2010-08-08 DIAGNOSIS — M79609 Pain in unspecified limb: Secondary | ICD-10-CM

## 2010-08-08 LAB — CBC WITH DIFFERENTIAL/PLATELET
Lymphocytes Relative: 42 % (ref 12–46)
Lymphs Abs: 2.7 10*3/uL (ref 0.7–4.0)
Neutro Abs: 3.2 10*3/uL (ref 1.7–7.7)
RBC: 4.68 MIL/uL (ref 4.22–5.81)
WBC: 6.5 10*3/uL (ref 4.0–10.5)

## 2010-08-08 NOTE — Assessment & Plan Note (Signed)
New onset R foot pain w/o trauma with localized redness and pain over the dorsalis pedis artery.  Will obtain an arterial doppler ultrasound to r/o arterial clot today and will get an ESR and CBC with diff today.  Unlikely gout or osteoarthritis since this is not located over a joint.  Consider dx of metatarsal stress fracture.  If everything comes back normal, will proceed with an XRAY tomorrow.  Can use ice, NSAIDS and vicodin for now and limit walking.

## 2010-08-08 NOTE — Progress Notes (Signed)
  Subjective:    Patient ID: CHRYSTOPHER STANGL, male    DOB: 04-May-1951, 60 y.o.   MRN: [id number]  HPI 60 yo WM presents for a R dorsal foot pain with localized redness for the past 2 days w/o trauma.  He has been walking more on the treadmill.  He thought it was just his arthritis but this is not located over a joint.  Denies bruising or swelling.  Denies tingling or numbness in the toes.  Has pain with sitting and ambulating.  He is limping.  Getting a little relief from Celebrex and using some vicodin for pain.  Denies fevers or chills.      Review of Systems as per HPI     Objective:   Physical Exam  Constitutional: He appears well-developed and well-nourished.  Cardiovascular: Normal rate, regular rhythm, normal heart sounds and intact distal pulses.   No murmur heard. Pulmonary/Chest: Effort normal and breath sounds normal.  Musculoskeletal: He exhibits tenderness. He exhibits no edema.  Neurological:       Antalgic gait  Skin: Skin is warm and dry.             Assessment & Plan:

## 2010-08-08 NOTE — Patient Instructions (Signed)
Will get labs and an u/s today. Will call you w/ results tomorrow.

## 2010-08-12 ENCOUNTER — Telehealth: Payer: Self-pay | Admitting: Family Medicine

## 2010-08-12 NOTE — Telephone Encounter (Signed)
Pt aware of the above  

## 2010-08-12 NOTE — Telephone Encounter (Signed)
Michelle, pls call pt and let him know that his lower extremity arterial u/s came back normal.  No sign of arterial blood clot.  Continue ice and anti inflammatories and let me know if redness and pain has not resolved by the end of this week.

## 2010-10-02 ENCOUNTER — Encounter: Payer: Self-pay | Admitting: Emergency Medicine

## 2010-10-02 ENCOUNTER — Inpatient Hospital Stay (INDEPENDENT_AMBULATORY_CARE_PROVIDER_SITE_OTHER)
Admission: RE | Admit: 2010-10-02 | Discharge: 2010-10-02 | Disposition: A | Discharge status: Home / Self Care | Payer: BC Managed Care – PPO | Source: Ambulatory Visit | Attending: Emergency Medicine | Admitting: Emergency Medicine

## 2010-10-02 DIAGNOSIS — M7750 Other enthesopathy of unspecified foot: Secondary | ICD-10-CM

## 2010-10-02 DIAGNOSIS — M109 Gout, unspecified: Secondary | ICD-10-CM

## 2010-11-18 ENCOUNTER — Other Ambulatory Visit: Payer: Self-pay | Admitting: *Deleted

## 2010-11-18 MED ORDER — CELECOXIB 200 MG PO CAPS: 200.0000 mg | ORAL_CAPSULE | Freq: Every day | ORAL | Status: DC | PRN

## 2010-11-26 ENCOUNTER — Encounter: Payer: Self-pay | Admitting: Family Medicine

## 2010-11-26 ENCOUNTER — Ambulatory Visit (INDEPENDENT_AMBULATORY_CARE_PROVIDER_SITE_OTHER): Payer: BC Managed Care – PPO | Admitting: Family Medicine

## 2010-11-26 DIAGNOSIS — E785 Hyperlipidemia, unspecified: Secondary | ICD-10-CM

## 2010-11-26 DIAGNOSIS — M25529 Pain in unspecified elbow: Secondary | ICD-10-CM

## 2010-11-26 DIAGNOSIS — M109 Gout, unspecified: Secondary | ICD-10-CM

## 2010-11-26 MED ORDER — CELECOXIB 200 MG PO CAPS: 200.0000 mg | ORAL_CAPSULE | Freq: Every day | ORAL | Status: DC | PRN

## 2010-11-26 MED ORDER — OMEPRAZOLE 20 MG PO CPDR: 20.0000 mg | DELAYED_RELEASE_CAPSULE | Freq: Every day | ORAL | Status: AC

## 2010-11-26 MED ORDER — LOVASTATIN 40 MG PO TABS: 40.0000 mg | ORAL_TABLET | Freq: Every day | ORAL | Status: DC

## 2010-11-26 NOTE — Assessment & Plan Note (Signed)
Likely the cause for his chronic foot pain but he declined to go back on allopurinol at this point.  He is agreeable to seeing podiatrist and I think this is a good idea to see if he needs to be back on medication and to get him back to regular exercise.  I did RF his Celebrex today.

## 2010-11-26 NOTE — Assessment & Plan Note (Signed)
Did well on CRestor but has come off of it and it's not covered by insurance.  Reviewed his last set of labs from [place].  Will change him to Lovastatin and recheck labs in 3 mos.

## 2010-11-26 NOTE — Assessment & Plan Note (Signed)
hmproved with rest and after seeing ortho.  He wants to start lifting weights again.  I suggest he see sports medicine to see if there is specific rehab or injection that can be tried.

## 2010-11-26 NOTE — Progress Notes (Signed)
  Subjective:    Patient ID: Raymond Stark, male    DOB: 1950/10/18, 60 y.o.   MRN: [id number]  HPI  60 yo WM presents for f/u gout -- off meds, f/u cholesterol -- ran out of Crestor samples months ago and continued L elbow pain after seeing the orthopedist in [place].  He would really like to get back to exercise, noting that he feels deconditioned and is eating more junk food now.  He is limited with weight lifting due to L elbow pain and limited with extensive walking / running due to bilat foot pain, worsened since he's had gout.  He needs RF of Celebrex.    BP 125/79  Pulse 60  Ht 6' (1.829 m)  Wt 215 lb (97.523 kg)  BMI 29.16 kg/m2  SpO2 98%   Review of Systems  Constitutional: Negative for fatigue.  Respiratory: Negative for chest tightness and shortness of breath.   Cardiovascular: Negative for chest pain, palpitations and leg swelling.  Musculoskeletal: Negative for joint swelling and gait problem.  Psychiatric/Behavioral: Negative for dysphoric mood.       Objective:   Physical Exam  Constitutional: He appears well-developed and well-nourished. No distress.  HENT:  Mouth/Throat: Oropharynx is clear and moist.  Neck: Neck supple.  Cardiovascular: Normal rate, regular rhythm and normal heart sounds.   Pulmonary/Chest: Effort normal and breath sounds normal.  Musculoskeletal: He exhibits no edema.  Skin: Skin is warm and dry.  Psychiatric: He has a normal mood and affect.          Assessment & Plan:

## 2010-11-26 NOTE — Patient Instructions (Addendum)
Meds RFd.    Change cholesterol medicine to Lovastatin at bedtime.    Stay off Allopurinol.  F/u with sports medicine for our L elbow. F/u with podiatry for your foot pain/ gout  Return for f/u cholesterol/ labs in 3 mos.

## 2010-11-28 ENCOUNTER — Encounter: Payer: Self-pay | Admitting: Family Medicine

## 2010-11-28 ENCOUNTER — Ambulatory Visit (INDEPENDENT_AMBULATORY_CARE_PROVIDER_SITE_OTHER): Payer: BC Managed Care – PPO | Admitting: Family Medicine

## 2010-11-28 VITALS — BP 122/80 | Temp 97.4°F | Ht 71.0 in | Wt 217.4 lb

## 2010-11-28 DIAGNOSIS — M25529 Pain in unspecified elbow: Secondary | ICD-10-CM

## 2010-11-28 NOTE — Patient Instructions (Signed)
You have two areas of calcifications in your triceps tendon near the insertion that fit with having had prior triceps partial tears or severe strains with now persistent tendinopathy. Physical therapy is the most important part of treatment for this. Icing 15 minutes at a time 3-4 times a day especially after therapy. They will work you back into a weightlifting regimen but slowly. Tylenol or aleve as needed for pain. There aren't really any braces or sleeves that will help with this condition. Follow up with me in 6 weeks for a recheck on how you're doing - if not improving we can consider nitro patches.

## 2010-12-03 ENCOUNTER — Ambulatory Visit: Payer: BC Managed Care – PPO | Attending: Family Medicine

## 2010-12-03 ENCOUNTER — Encounter: Payer: Self-pay | Admitting: Family Medicine

## 2010-12-03 DIAGNOSIS — M25529 Pain in unspecified elbow: Secondary | ICD-10-CM | POA: Insufficient documentation

## 2010-12-03 DIAGNOSIS — IMO0001 Reserved for inherently not codable concepts without codable children: Secondary | ICD-10-CM | POA: Insufficient documentation

## 2010-12-04 ENCOUNTER — Encounter: Payer: Self-pay | Admitting: Family Medicine
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
## 2010-12-06 ENCOUNTER — Ambulatory Visit: Payer: BC Managed Care – PPO | Admitting: Physical Therapy

## 2010-12-09 ENCOUNTER — Ambulatory Visit: Payer: BC Managed Care – PPO | Admitting: Physical Therapy

## 2010-12-12 ENCOUNTER — Ambulatory Visit: Payer: BC Managed Care – PPO | Admitting: Physical Therapy

## 2010-12-17 ENCOUNTER — Ambulatory Visit: Payer: BC Managed Care – PPO | Attending: Family Medicine | Admitting: Physical Therapy

## 2010-12-17 DIAGNOSIS — IMO0001 Reserved for inherently not codable concepts without codable children: Secondary | ICD-10-CM | POA: Insufficient documentation

## 2010-12-17 DIAGNOSIS — M25529 Pain in unspecified elbow: Secondary | ICD-10-CM | POA: Insufficient documentation

## 2010-12-20 ENCOUNTER — Ambulatory Visit: Payer: BC Managed Care – PPO | Attending: Family Medicine | Admitting: Physical Therapy

## 2010-12-20 DIAGNOSIS — M25529 Pain in unspecified elbow: Secondary | ICD-10-CM | POA: Insufficient documentation

## 2010-12-20 DIAGNOSIS — IMO0001 Reserved for inherently not codable concepts without codable children: Secondary | ICD-10-CM | POA: Insufficient documentation

## 2010-12-23 ENCOUNTER — Ambulatory Visit: Payer: BC Managed Care – PPO | Admitting: Physical Therapy

## 2010-12-23 ENCOUNTER — Inpatient Hospital Stay (INDEPENDENT_AMBULATORY_CARE_PROVIDER_SITE_OTHER)
Admission: RE | Admit: 2010-12-23 | Discharge: 2010-12-23 | Disposition: A | Discharge status: Home / Self Care | Payer: BC Managed Care – PPO | Source: Ambulatory Visit | Attending: Emergency Medicine | Admitting: Emergency Medicine

## 2010-12-23 ENCOUNTER — Encounter: Payer: Self-pay | Admitting: Emergency Medicine

## 2010-12-23 DIAGNOSIS — T2200XA Burn of unspecified degree of shoulder and upper limb, except wrist and hand, unspecified site, initial encounter: Secondary | ICD-10-CM | POA: Insufficient documentation

## 2010-12-23 DIAGNOSIS — IMO0002 Reserved for concepts with insufficient information to code with codable children: Secondary | ICD-10-CM | POA: Insufficient documentation

## 2010-12-25 ENCOUNTER — Encounter: Payer: BC Managed Care – PPO | Admitting: Physical Therapy
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
## 2011-04-21 NOTE — Progress Notes (Signed)
Summary: RIGHT ANKLE PAIN Room 5   Vital Signs:  Patient Profile:   60 Years Old Male CC:      Rt ankle pain x 4 days Height:     71 inches Weight:      218 pounds O2 Sat:      97 % O2 treatment:    Room Air Temp:     97.7 degrees F oral Pulse rate:   73 / minute Pulse rhythm:   regular Resp:     16 per minute BP sitting:   134 / 77  (left arm) Cuff size:   regular  Vitals Entered By: Emilio Math (Oct 02, 2010 10:59 AM)                  Current Allergies: No known allergies History of Present Illness History from: patient Chief Complaint: Rt ankle pain x 4 days History of Present Illness: R ankle pain for a few days.  He was [facility] the [facility] and walked a few miles.  He has pain on the inside and outside of his ankle.  Didn't roll it and no trauma.  Mild swelling.  He also has a history of gout that tends to flare when he injures his feet or ankles.    Current Meds CELEBREX 200 MG CAPS (CELECOXIB) once a day as needed BAYER ASPIRIN EC LOW DOSE 81 MG TBEC (ASPIRIN) 2 tablets a day OMEPRAZOLE 20 MG CPDR (OMEPRAZOLE) Take 1 tab by mouth once daily ALLOPURINOL 100 MG TABS (ALLOPURINOL) 2 tabs by mouth once daily PREDNISONE (PAK) 10 MG TABS (PREDNISONE) 12 day pack, use as directed LORTAB 5-500 MG TABS (HYDROCODONE-ACETAMINOPHEN) 1 by mouth at bedtime as needed for pain  REVIEW OF SYSTEMS Constitutional Symptoms      Denies fever, chills, night sweats, weight loss, weight gain, and fatigue.  Eyes       Denies change in vision, eye pain, eye discharge, glasses, contact lenses, and eye surgery. Ear/Nose/Throat/Mouth       Denies hearing loss/aids, change in hearing, ear pain, ear discharge, dizziness, frequent runny nose, frequent nose bleeds, sinus problems, sore throat, hoarseness, and tooth pain or bleeding.  Respiratory       Denies dry cough, productive cough, wheezing, shortness of breath, asthma, bronchitis, and emphysema/COPD.  Cardiovascular       Denies murmurs,  chest pain, and tires easily with exhertion.    Gastrointestinal       Denies stomach pain, nausea/vomiting, diarrhea, constipation, blood in bowel movements, and indigestion. Genitourniary       Denies painful urination, kidney stones, and loss of urinary control. Neurological       Denies paralysis, seizures, and fainting/blackouts. Musculoskeletal       Complains of muscle pain, joint pain, joint stiffness, decreased range of motion, and swelling.      Denies redness, muscle weakness, and gout.  Skin       Denies bruising, unusual mles/lumps or sores, and hair/skin or nail changes.  Psych       Denies mood changes, temper/anger issues, anxiety/stress, speech problems, depression, and sleep problems.  Past History:  Past Medical History: Reviewed history from 11/08/2009 and no changes required. arthritis GERD hx of TIA high cholesterol GERD L spine arthritis hx of microscopic hematuria  ETT 1997  Past Surgical History: Reviewed history from 06/25/2009 and no changes required. Lipoma removed under jaw in the 1990s  Family History: Reviewed history from 06/25/2009 and no changes required. 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 Denies ETOH. Walks 30 min/ day. Physical Exam General appearance: well developed, well nourished, no acute distress Head: normocephalic, atraumatic MSE: oriented to time, place, and person R ankle: FROM, full strength, resisted eversion is painful.  +TTP along course of peroneal tendon mostly with milder TTP medially on posterior tibial tendon.  No TTP medial/lateral malleolus, navicular, base of 5th, calcaneus, Achilles, or proximal fibula.  + Mild swelling.  No ecchymoses. Antalgic gait.  Slight warmth throughout ankle and  medial redness. Assessment New Problems: OTHER ENTHESOPATHY OF ANKLE AND TARSUS (ICD-726.79)   Plan New Medications/Changes: LORTAB 5-500 MG TABS (HYDROCODONE-ACETAMINOPHEN) 1 by mouth at bedtime as needed for pain  #10 x 0, 10/02/2010, Hoyt Koch MD PREDNISONE (PAK) 10 MG TABS (PREDNISONE) 12 day pack, use as directed  #1 x 0, 10/02/2010, Hoyt Koch MD  New Orders: Est. Patient Level IV [phone] Planning Comments:   No Xray obtained since [place] ankle rules not met.  This appears to be peroneal tendonopathy from overuse.  Encourage ice, elevation, rest.  Pt has boot at home that I have encouraged him to use over the next week to shut down his ankle and allow to heal.  Rx for Prednisone in case his gout flares back up.  Then Lortab to use only at night.  If not improving by next week, should follow up with sports medicine.   The patient and/or caregiver has been counseled thoroughly with regard to medications prescribed including dosage, schedule, interactions, rationale for use, and possible side effects and they verbalize understanding.  Diagnoses and expected course of recovery discussed and will return if not improved as expected or if the condition worsens. Patient and/or caregiver verbalized understanding.  Prescriptions: LORTAB 5-500 MG TABS (HYDROCODONE-ACETAMINOPHEN) 1 by mouth at bedtime as needed for pain  #10 x 0   Entered and Authorized by:   Hoyt Koch MD   Signed by:   Hoyt Koch MD on 10/02/2010   Method used:   Print then Give to Patient   RxID:   [id number] PREDNISONE (PAK) 10 MG TABS (PREDNISONE) 12 day pack, use as directed  #1 x 0   Entered and Authorized by:   Hoyt Koch MD   Signed by:   Hoyt Koch MD on 10/02/2010   Method used:   Print then Give to Patient   RxID:   [id number]   Orders Added: 1)  Est. Patient Level IV [29528]

## 2011-10-27 ENCOUNTER — Encounter: Payer: Self-pay | Admitting: *Deleted

## 2011-10-27 ENCOUNTER — Emergency Department (INDEPENDENT_AMBULATORY_CARE_PROVIDER_SITE_OTHER)
Admission: EM | Admit: 2011-10-27 | Discharge: 2011-10-27 | Disposition: A | Discharge status: Home / Self Care | Payer: BC Managed Care – PPO | Source: Home / Self Care | Attending: Family Medicine | Admitting: Family Medicine

## 2011-10-27 DIAGNOSIS — M79609 Pain in unspecified limb: Secondary | ICD-10-CM

## 2011-10-27 DIAGNOSIS — M79672 Pain in left foot: Secondary | ICD-10-CM

## 2011-10-27 HISTORY — DX: Gout, unspecified: M10.9

## 2011-10-27 MED ORDER — PREDNISONE 10 MG PO TABS: ORAL_TABLET | ORAL | Status: DC

## 2011-10-27 NOTE — ED Notes (Addendum)
Pt c/o bilateral foot pain and redness x1 day, RT foot is worse. He took prednisone 20mg  this morning with some relief.

## 2011-10-27 NOTE — Discharge Instructions (Signed)
Increase fluid intake.  Elevate legs whenever possible.

## 2011-10-27 NOTE — ED Provider Notes (Signed)
History     CSN: [id number]  Arrival date & time 10/27/11  0906   First MD Initiated Contact with Patient 10/27/11 [phone]      Chief Complaint  Patient presents with  . Foot Pain      HPI Comments: Patient states that he awoke yesterday with bilateral foot pain, somewhat worse on the right.  He states that he was on his feet more than usual two days ago.  He states that he has a history of chronic arthritis in his feet and occasional episodes of acute gout.  He had some left-over prednisone and took 10mg  this morning and has already noticed some decrease in pain.  No fevers, chills, and sweats.  No recent trauma to the feet.  He is not presently taking allopurinol.  Patient is a 61 y.o. male presenting with lower extremity pain. The history is provided by the patient.  Foot Pain This is a recurrent problem. The current episode started yesterday. The problem occurs constantly. The problem has been gradually worsening. Associated symptoms comments: none. The symptoms are aggravated by walking and standing. Relieved by: prednisone. Treatments tried: prednisone. The treatment provided mild relief.    Past Medical History  Diagnosis Date  . GERD (gastroesophageal reflux disease)   . TIA (transient ischemic attack)   . Hyperlipidemia   . Arthritis     Left spine  . Microscopic hematuria   . Gout     Past Surgical History  Procedure Date  . Lipoma removed under jaw 1990's    Family History  Problem Relation Age of Onset  . Heart attack Father   . Hypertension Father   . Heart failure Father     History  Substance Use Topics  . Smoking status: Never Smoker   . Smokeless tobacco: Not on file  . Alcohol Use: Yes      Review of Systems  All other systems reviewed and are negative.    Allergies  Review of patient's allergies indicates no known allergies.  Home Medications   Current Outpatient Rx  Name Route Sig Dispense Refill  . ALLOPURINOL 100 MG PO TABS Oral Take  by mouth daily. 2 tabs     . ASPIRIN 81 MG PO TBEC Oral Take by mouth. 2 tabs daily     . CELECOXIB 200 MG PO CAPS Oral Take 1 capsule (200 mg total) by mouth daily as needed. 90 capsule 1  . HYDROCODONE-ACETAMINOPHEN 5-500 MG PO TABS      . LOVASTATIN 40 MG PO TABS Oral Take 1 tablet (40 mg total) by mouth daily. 90 tablet 1  . OMEPRAZOLE 20 MG PO CPDR Oral Take 1 capsule (20 mg total) by mouth daily. 90 capsule 1  . PREDNISONE (PAK) 10 MG PO TABS      . PREDNISONE 10 MG PO TABS  Take 2 tabs by mouth today, then two tabs twice daily for two days, then one tab twice daily for 2 days, then 1 tab daily for two days.  Take PC 16 tablet 0    BP 129/81  Pulse 69  Temp(Src) 98.2 F (36.8 C) (Oral)  Resp 18  Ht 5\' 11"  (1.803 m)  Wt 212 lb 8 oz (96.389 kg)  BMI 29.64 kg/m2  SpO2 97%  Physical Exam Nursing notes and Vital Signs reviewed. Appearance:  Patient appears healthy, stated age, and in no acute distress Eyes:  Pupils are equal, round, and reactive to light and accomodation.  Extraocular movement is intact.  Conjunctivae are not inflamed  Extremities:  No edema.  No calf tenderness.  Right foot has tenderness dorsally over mid-foot but no swelling or warmth.  Right ankle has tenderness around medial malleolus.  Left foot has mild tenderness/erythema dorsally over distal 2nd, 3rd, and 4th metatarsals and MTP joints.  No warmth.  Toes have decreased range of motion.  Distal sensation is intact. Vascular:  Pedal pulses are intact.   Skin:  No rash present.   ED Course  Procedures  none      1. Bilateral foot pain; suspect combination of acute gout and chronic arthitis      MDM   Begin tapering course of prednisone. Increase fluid intake.  Elevate legs whenever possible. Followup with Family Doctor if not improved in 4 days.        Lattie Haw, MD 10/27/11 [phone]

## 2012-07-02 IMAGING — CR DG ELBOW 2V*L*
2 series · 2 of 2 positions shown · non-contrast
Comparison: None.

CLINICAL DATA: Left elbow pain.

LEFT ELBOW - 2 VIEW

[view not recorded (1 of 2)]
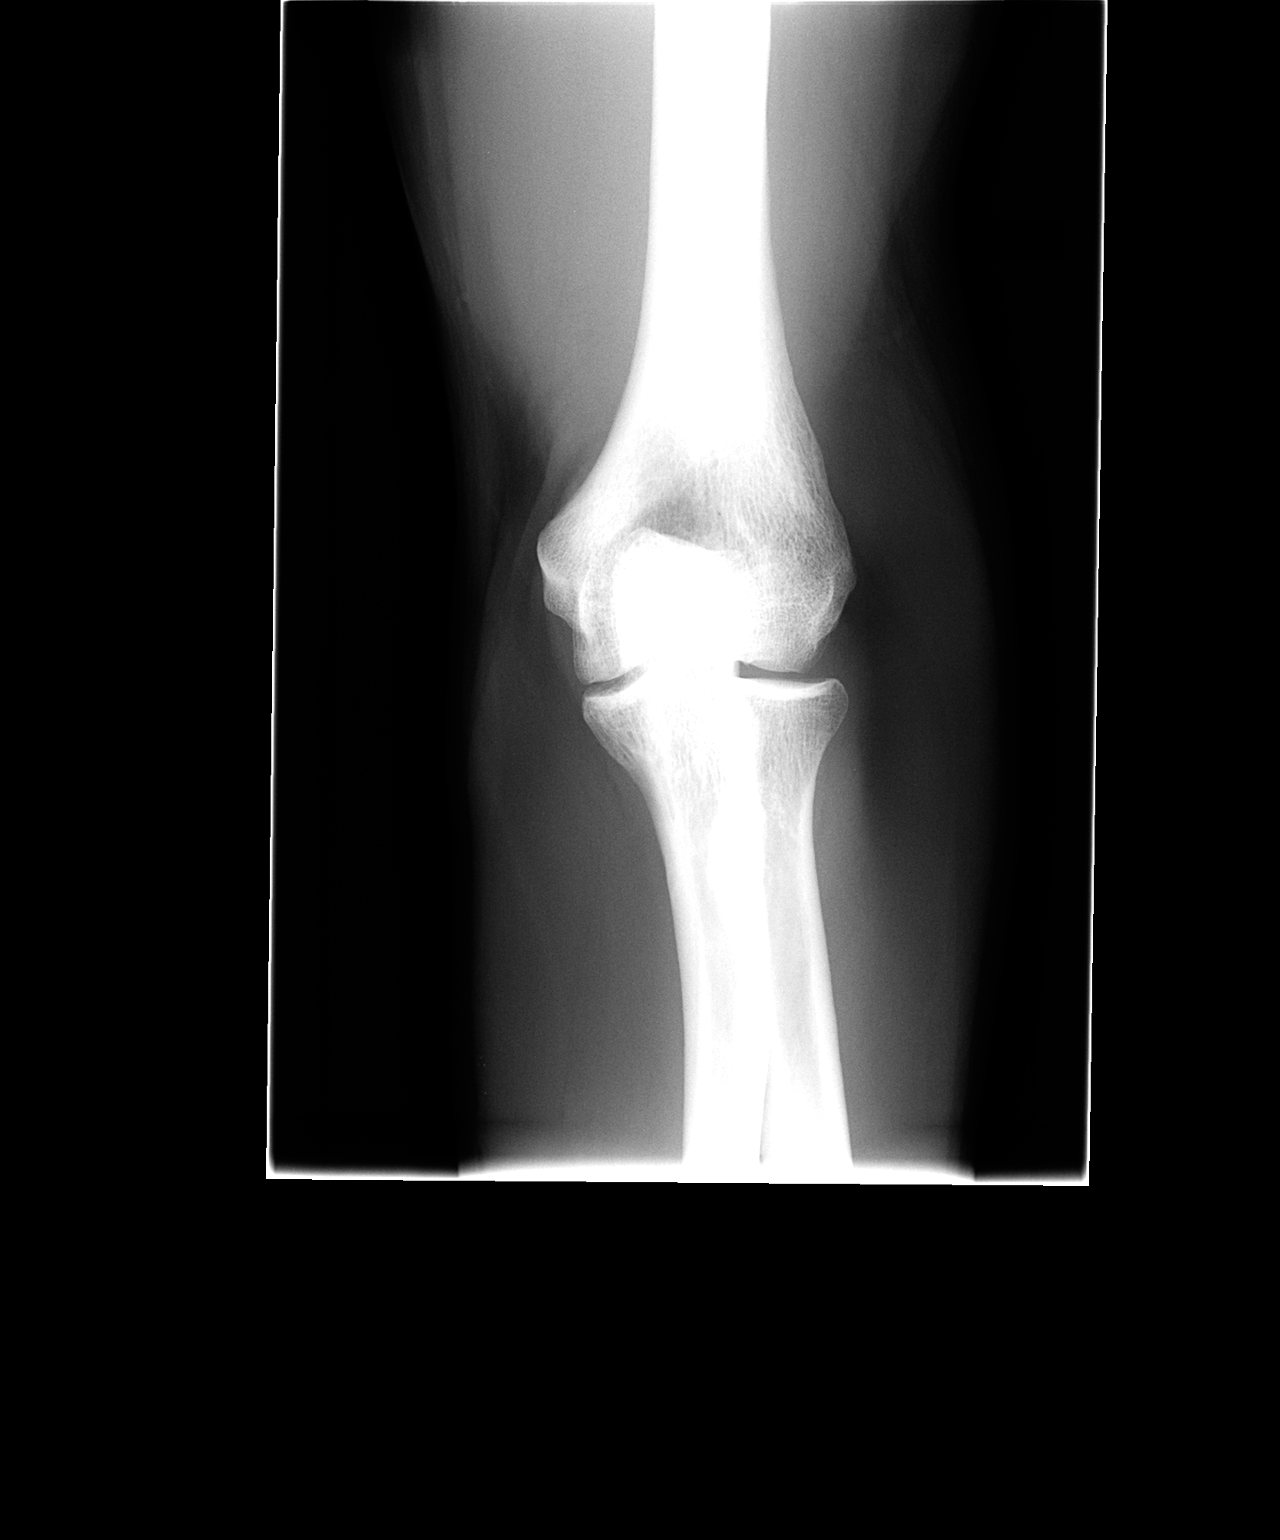

[view not recorded (2 of 2)]
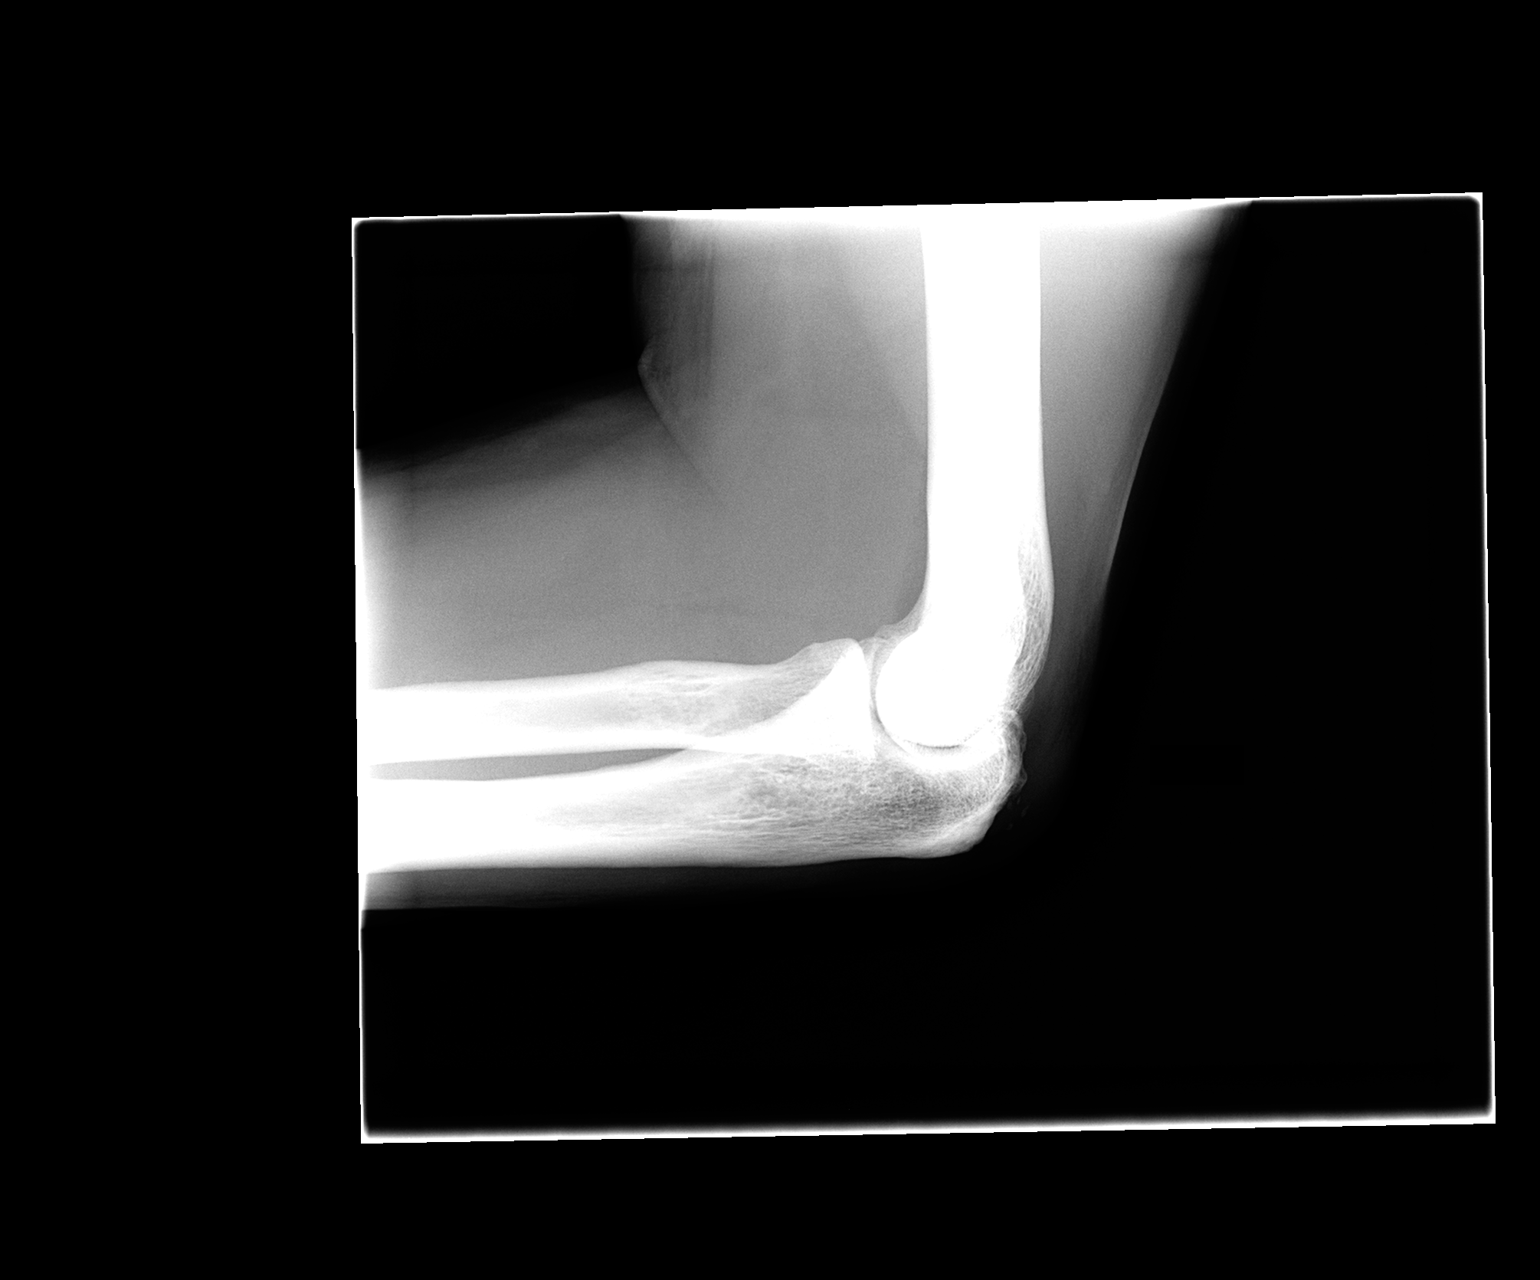

[2 of 2 positions shown; findings below may reference images not displayed]

FINDINGS: No acute osseous or joint abnormality.  No osseous
erosions or overhanging edges.
IMPRESSION: No acute findings.

## 2016-01-04 ENCOUNTER — Emergency Department
Admission: EM | Admit: 2016-01-04 | Discharge: 2016-01-04 | Disposition: A | Discharge status: Home / Self Care | Payer: BLUE CROSS/BLUE SHIELD | Source: Home / Self Care | Attending: Family Medicine | Admitting: Family Medicine

## 2016-01-04 ENCOUNTER — Encounter: Payer: Self-pay | Admitting: Emergency Medicine

## 2016-01-04 DIAGNOSIS — M10072 Idiopathic gout, left ankle and foot: Secondary | ICD-10-CM | POA: Diagnosis not present

## 2016-01-04 MED ORDER — PREDNISONE 20 MG PO TABS: ORAL_TABLET | ORAL | 0 refills | Status: DC

## 2016-01-04 MED ORDER — OXYCODONE-ACETAMINOPHEN 5-325 MG PO TABS: 1.0000 | ORAL_TABLET | ORAL | 0 refills | Status: AC | PRN

## 2016-01-04 NOTE — Discharge Instructions (Signed)
Increase fluid intake.

## 2016-01-04 NOTE — ED Provider Notes (Signed)
[facility]    CSN: [id number] Arrival date & time: 01/04/16  1241  First Provider Contact:  None       History   Chief Complaint Chief Complaint  Patient presents with  . Gout    HPI Raymond Stark is a 65 y.o. male.   Patient complains of one week history of recurrent gout in his left ankle. He has a history of recurring gout, having been treated for a flare-up in his right ankle two months ago.  He reports that his gout attacks respond to prednisone, but often needs a long taper.  He has had poor response in past to allopurinol and colchicine.   The history is provided by the patient.  Ankle Pain  Location:  Ankle Time since incident:  1 week Injury: no   Ankle location:  L ankle Pain details:    Quality:  Aching   Radiates to:  Does not radiate   Severity:  Moderate   Onset quality:  Sudden   Duration:  1 week   Timing:  Constant   Progression:  Worsening Chronicity:  Recurrent Prior injury to area:  No Relieved by:  None tried Worsened by:  Bearing weight Ineffective treatments:  None tried Associated symptoms: decreased ROM and swelling   Associated symptoms: no fever and no tingling   Risk factors comment:  History of gout   Past Medical History:  Diagnosis Date  . Arthritis    Left spine  . GERD (gastroesophageal reflux disease)   . Gout   . Hyperlipidemia   . Microscopic hematuria   . TIA (transient ischemic attack)     Patient Active Problem List   Diagnosis Date Noted  . CELLULITIS AND ABSCESS OF UPPER ARM AND FOREARM 12/23/2010  . BURN, FOREARM 12/23/2010  . OTHER ENTHESOPATHY OF ANKLE AND TARSUS 10/02/2010  . ELBOW PAIN, LEFT 07/29/2010  . GOUT, UNSPECIFIED 05/01/2010  . Right foot pain 06/25/2009  . HYPERLIPIDEMIA 06/25/2009  . ECZEMA, ATOPIC 05/20/2009  . INSECT BITE 10/08/2008    Past Surgical History:  Procedure Laterality Date  . lipoma removed under jaw  1990's       Home Medications    Prior to  Admission medications   Medication Sig Start Date End Date Taking? Authorizing Provider  omeprazole (PRILOSEC) 20 MG capsule Take 1 capsule (20 mg total) by mouth daily. 11/26/10   Scot JunKaren E Bowen, DO  oxyCODONE-acetaminophen (ROXICET) 5-325 MG tablet Take 1 tablet by mouth every 4 (four) hours as needed for severe pain. 01/04/16   Lattie HawStephen A Jodie Leiner, MD  predniSONE (DELTASONE) 20 MG tablet Take one tab by mouth TID for 5 days, then BID for 5 days, then one daily for 3 days. Take with food. 01/04/16   Lattie HawStephen A Kelcey Wickstrom, MD    Family History Family History  Problem Relation Age of Onset  . Heart attack Father   . Hypertension Father   . Heart failure Father     Social History Social History  Substance Use Topics  . Smoking status: Never Smoker  . Smokeless tobacco: Never Used  . Alcohol use Yes     Allergies   Review of patient's allergies indicates no known allergies.   Review of Systems Review of Systems  Constitutional: Negative for fever.  All other systems reviewed and are negative.    Physical Exam Triage Vital Signs ED Triage Vitals  Enc Vitals Group     BP 01/04/16 1307 140/81  Pulse Rate 01/04/16 1307 70     Resp --      Temp 01/04/16 1307 98 F (36.7 C)     Temp Source 01/04/16 1307 Oral     SpO2 01/04/16 1307 98 %     Weight 01/04/16 1307 200 lb (90.7 kg)     Height 01/04/16 1307 5\' 11"  (1.803 m)     Head Circumference --      Peak Flow --      Pain Score 01/04/16 1311 9     Pain Loc --      Pain Edu? --      Excl. in GC? --    No data found.   Updated Vital Signs BP 140/81 (BP Location: Left Arm)   Pulse 70   Temp 98 F (36.7 C) (Oral)   Ht 5\' 11"  (1.803 m)   Wt 200 lb (90.7 kg)   SpO2 98%   BMI 27.89 kg/m   Visual Acuity Right Eye Distance:   Left Eye Distance:   Bilateral Distance:    Right Eye Near:   Left Eye Near:    Bilateral Near:     Physical Exam  Constitutional: He is oriented to person, place, and time. He appears  well-developed and well-nourished. No distress.  HENT:  Head: Normocephalic.  Eyes: Pupils are equal, round, and reactive to light.  Neck: Neck supple.  Cardiovascular: Normal heart sounds.   Pulmonary/Chest: Breath sounds normal.  Abdominal: There is no tenderness.  Musculoskeletal: He exhibits tenderness. He exhibits no edema.       Left ankle: He exhibits decreased range of motion and swelling. He exhibits normal pulse. Tenderness. Lateral malleolus and medial malleolus tenderness found.       Feet:  There is tenderness to palpation and mild swelling of left ankle as noted on diagram. Distal neurovascular function is intact.   Neurological: He is alert and oriented to person, place, and time.  Skin: Skin is warm and dry. There is erythema.  Nursing note and vitals reviewed.    UC Treatments / Results  Labs (all labs ordered are listed, but only abnormal results are displayed) Labs Reviewed - No data to display  EKG  EKG Interpretation None       Radiology No results found.  Procedures Procedures (including critical care time)  Medications Ordered in UC Medications - No data to display   Initial Impression / Assessment and Plan / UC Course  I have reviewed the triage vital signs and the nursing notes.  Pertinent labs & imaging results that were available during my care of the patient were reviewed by me and considered in my medical decision making (see chart for details).  Clinical Course       Final Clinical Impressions(s) / UC Diagnoses   Final diagnoses:  Acute idiopathic gout of left ankle   Increase fluid intake.  Begin prednisone burst/taper over 13 days.  Percocet for pain. Followup with Family Doctor if not improved in two weeks.  New Prescriptions Discharge Medication List as of 01/04/2016  1:57 PM    START taking these medications   Details  oxyCODONE-acetaminophen (ROXICET) 5-325 MG tablet Take 1 tablet by mouth every 4 (four) hours as  needed for severe pain., Starting Fri 01/04/2016, Print    predniSONE (DELTASONE) 20 MG tablet Take one tab by mouth TID for 5 days, then BID for 5 days, then one daily for 3 days. Take with food., Print  Lattie Haw, MD 01/04/16 [phone]

## 2016-01-04 NOTE — ED Triage Notes (Signed)
Left ankle pain x 2 days, swollen and painful, 7-9, hx gout.

## 2016-01-18 ENCOUNTER — Emergency Department
Admission: EM | Admit: 2016-01-18 | Discharge: 2016-01-18 | Disposition: A | Discharge status: Home / Self Care | Payer: BLUE CROSS/BLUE SHIELD | Source: Home / Self Care | Attending: Family Medicine | Admitting: Family Medicine

## 2016-01-18 ENCOUNTER — Encounter: Payer: Self-pay | Admitting: *Deleted

## 2016-01-18 DIAGNOSIS — M10072 Idiopathic gout, left ankle and foot: Secondary | ICD-10-CM | POA: Diagnosis not present

## 2016-01-18 MED ORDER — PREDNISONE 20 MG PO TABS: ORAL_TABLET | ORAL | 0 refills | Status: AC

## 2016-01-18 MED ORDER — INDOMETHACIN 50 MG PO CAPS: 50.0000 mg | ORAL_CAPSULE | Freq: Three times a day (TID) | ORAL | 1 refills | Status: AC

## 2016-01-18 NOTE — Discharge Instructions (Signed)
Increase fluid intake. May take cyclobenzaprine (Flexeril) 10mg , one-half tab at bedtime as needed.

## 2016-01-18 NOTE — ED Provider Notes (Signed)
[facility]    CSN: [id number] Arrival date & time: 01/18/16  1233  First Provider Contact:  None       History   Chief Complaint Chief Complaint  Patient presents with  . Foot Pain  . Gout    HPI KALMAN NYLEN is a 65 y.o. male.   Patient had a flareup of gout in his left ankle two weeks ago, treated with a prednisone burst/taper and oxycodone.  He improved, but his pain has worsened over the past two days as he has decreased his prednisone dose.  He notes that oxycodone keeps him awake at night.  He has arranged an appointment with a rheumatologist in approximately one month.  He would like to try indomethacin.    Ankle Pain  Location:  Ankle Time since incident:  3 days Injury: no   Ankle location:  L ankle Pain details:    Quality:  Aching   Radiates to:  Does not radiate   Severity:  Mild   Onset quality:  Gradual   Duration:  3 days   Timing:  Constant   Progression:  Worsening Chronicity:  Chronic Relieved by: prednisone. Worsened by:  Bearing weight Associated symptoms: decreased ROM, stiffness and swelling   Associated symptoms: no fatigue, no fever, no muscle weakness, no numbness and no tingling     Past Medical History:  Diagnosis Date  . Arthritis    Left spine  . GERD (gastroesophageal reflux disease)   . Gout   . Hyperlipidemia   . Microscopic hematuria   . TIA (transient ischemic attack)     Patient Active Problem List   Diagnosis Date Noted  . CELLULITIS AND ABSCESS OF UPPER ARM AND FOREARM 12/23/2010  . BURN, FOREARM 12/23/2010  . OTHER ENTHESOPATHY OF ANKLE AND TARSUS 10/02/2010  . ELBOW PAIN, LEFT 07/29/2010  . GOUT, UNSPECIFIED 05/01/2010  . Right foot pain 06/25/2009  . HYPERLIPIDEMIA 06/25/2009  . ECZEMA, ATOPIC 05/20/2009  . INSECT BITE 10/08/2008    Past Surgical History:  Procedure Laterality Date  . lipoma removed under jaw  1990's       Home Medications    Prior to Admission medications     Medication Sig Start Date End Date Taking? Authorizing Provider  indomethacin (INDOCIN) 50 MG capsule Take 1 capsule (50 mg total) by mouth 3 (three) times daily with meals. 01/18/16   Lattie Haw, MD  omeprazole (PRILOSEC) 20 MG capsule Take 1 capsule (20 mg total) by mouth daily. 11/26/10   Scot Jun Bowen, DO  oxyCODONE-acetaminophen (ROXICET) 5-325 MG tablet Take 1 tablet by mouth every 4 (four) hours as needed for severe pain. 01/04/16   Lattie Haw, MD  predniSONE (DELTASONE) 20 MG tablet Take one tab by mouth TID for 5 days, then BID for 5 days, then one daily for 3 days. Take with food. 01/18/16   Lattie Haw, MD    Family History Family History  Problem Relation Age of Onset  . Heart attack Father   . Hypertension Father   . Heart failure Father     Social History Social History  Substance Use Topics  . Smoking status: Never Smoker  . Smokeless tobacco: Never Used  . Alcohol use Yes     Allergies   Review of patient's allergies indicates no known allergies.   Review of Systems Review of Systems  Constitutional: Negative for fatigue and fever.  Musculoskeletal: Positive for stiffness.  All other systems reviewed and  are negative.    Physical Exam Triage Vital Signs ED Triage Vitals  Enc Vitals Group     BP 01/18/16 1250 122/67     Pulse Rate 01/18/16 1250 76     Resp --      Temp --      Temp src --      SpO2 01/18/16 1250 97 %     Weight 01/18/16 1250 200 lb (90.7 kg)     Height --      Head Circumference --      Peak Flow --      Pain Score 01/18/16 1251 4     Pain Loc --      Pain Edu? --      Excl. in GC? --    No data found.   Updated Vital Signs BP 122/67 (BP Location: Left Arm)   Pulse 76   Wt 200 lb (90.7 kg)   SpO2 97%   BMI 27.89 kg/m   Visual Acuity Right Eye Distance:   Left Eye Distance:   Bilateral Distance:    Right Eye Near:   Left Eye Near:    Bilateral Near:     Physical Exam  Constitutional: He appears  well-developed and well-nourished. No distress.  HENT:  Head: Normocephalic.  Mouth/Throat: Oropharynx is clear and moist.  Eyes: Pupils are equal, round, and reactive to light.  Cardiovascular: Normal heart sounds.   Pulmonary/Chest: Breath sounds normal.  Abdominal: There is no tenderness.  Musculoskeletal:       Feet:  Left ankle slightly warm and tender to palpation, with mild swelling (not as much as on previous visit).  Lymphadenopathy:    He has no cervical adenopathy.  Skin: Skin is warm and dry.  Nursing note and vitals reviewed.    UC Treatments / Results  Labs (all labs ordered are listed, but only abnormal results are displayed) Labs Reviewed - No data to display  EKG  EKG Interpretation None       Radiology No results found.  Procedures Procedures (including critical care time)  Medications Ordered in UC Medications - No data to display   Initial Impression / Assessment and Plan / UC Course  I have reviewed the triage vital signs and the nursing notes.  Pertinent labs & imaging results that were available during my care of the patient were reviewed by me and considered in my medical decision making (see chart for details).  Clinical Course  Begin trial of Indocin 50mg  TID WC.  If not improved about one week, repeat prednisone taper. Increase fluid intake. May take cyclobenzaprine (Flexeril) 10mg , one-half tab at bedtime as needed. Followup with rheumatologist as presently scheduled.    Final Clinical Impressions(s) / UC Diagnoses   Final diagnoses:  Acute idiopathic gout of left ankle    New Prescriptions New Prescriptions   INDOMETHACIN (INDOCIN) 50 MG CAPSULE    Take 1 capsule (50 mg total) by mouth 3 (three) times daily with meals.     Lattie HawStephen A Beese, MD 01/27/16 1450

## 2016-01-18 NOTE — ED Triage Notes (Signed)
Pt was seen 01/04/16 for left ankle/foot pain with a gout flare. He improved but with 1 day of prednisone left the swelling and pain has returned. The rx for oxycodone helped his pain but reports it kept him awake @ night. PCP could not seem him for 1 month but did uric acid draw 5.8 result

## 2018-03-13 ENCOUNTER — Emergency Department (INDEPENDENT_AMBULATORY_CARE_PROVIDER_SITE_OTHER)
Admission: EM | Admit: 2018-03-13 | Discharge: 2018-03-13 | Disposition: A | Discharge status: Home / Self Care | Payer: BLUE CROSS/BLUE SHIELD | Source: Home / Self Care | Attending: Family Medicine | Admitting: Family Medicine

## 2018-03-13 ENCOUNTER — Other Ambulatory Visit: Payer: Self-pay

## 2018-03-13 ENCOUNTER — Emergency Department (INDEPENDENT_AMBULATORY_CARE_PROVIDER_SITE_OTHER): Payer: BLUE CROSS/BLUE SHIELD

## 2018-03-13 DIAGNOSIS — R05 Cough: Secondary | ICD-10-CM

## 2018-03-13 DIAGNOSIS — R079 Chest pain, unspecified: Secondary | ICD-10-CM

## 2018-03-13 DIAGNOSIS — J40 Bronchitis, not specified as acute or chronic: Secondary | ICD-10-CM | POA: Diagnosis not present

## 2018-03-13 MED ORDER — AZITHROMYCIN 250 MG PO TABS: ORAL_TABLET | ORAL | 0 refills | Status: AC

## 2018-03-13 NOTE — ED Triage Notes (Signed)
Pt c/o deep chested cough x 2 weeks. Started out as head congestion then has lingered as nagging dry cough that hurts his chest.

## 2018-03-13 NOTE — Discharge Instructions (Addendum)
Take plain guaifenesin (1200mg  extended release tabs such as Mucinex) twice daily, with plenty of water, for cough and congestion.  May add Pseudoephedrine (30mg , one or two every 4 to 6 hours) for sinus congestion.  Get adequate rest.   May use Afrin nasal spray (or generic oxymetazoline) each morning for about 5 days and then discontinue.  Also recommend using saline nasal spray several times daily and saline nasal irrigation (AYR is a common brand).   Try warm salt water gargles for sore throat.  Stop all antihistamines for now, and other non-prescription cough/cold preparations. May take Delsym Cough Suppressant at bedtime for nighttime cough.  Take prednisone 20mg  as follows:  One tab twice daily for five days, then one daily for three days (take with food).  Recommend a flu shot when well.

## 2018-03-13 NOTE — ED Provider Notes (Signed)
[facility]    CSN: [id number] Arrival date & time: 03/13/18  0901     History   Chief Complaint Chief Complaint  Patient presents with  . Cough    HPI Raymond Stark is a 67 y.o. male.   Patient complains of onset of a minimally productive cough about 1.5 weeks ago, with increased production of post-nasal mucous, but he did not feel ill.  During the past week the cough has become worse and less productive.  During the past several days he has developed increased nasal congestion.  He now has pain in his anterior chest with cough.  No fevers, chills, and sweats.  No pleuritic pain or shortness of breath.  His cough has not improved with Mucinex.  The history is provided by the patient.    Past Medical History:  Diagnosis Date  . Arthritis    Left spine  . GERD (gastroesophageal reflux disease)   . Gout   . Hyperlipidemia   . Microscopic hematuria   . TIA (transient ischemic attack)     Patient Active Problem List   Diagnosis Date Noted  . CELLULITIS AND ABSCESS OF UPPER ARM AND FOREARM 12/23/2010  . BURN, FOREARM 12/23/2010  . OTHER ENTHESOPATHY OF ANKLE AND TARSUS 10/02/2010  . ELBOW PAIN, LEFT 07/29/2010  . GOUT, UNSPECIFIED 05/01/2010  . Right foot pain 06/25/2009  . HYPERLIPIDEMIA 06/25/2009  . ECZEMA, ATOPIC 05/20/2009  . INSECT BITE 10/08/2008    Past Surgical History:  Procedure Laterality Date  . lipoma removed under jaw  1990's       Home Medications    Prior to Admission medications   Medication Sig Start Date End Date Taking? Authorizing Provider  allopurinol (ZYLOPRIM) 100 MG tablet Take 100 mg by mouth daily.   Yes [provider]  azithromycin (ZITHROMAX Z-PAK) 250 MG tablet Take 2 tabs today; then begin one tab once daily for 4 more days. 03/13/18   Lattie Haw, MD  indomethacin (INDOCIN) 50 MG capsule Take 1 capsule (50 mg total) by mouth 3 (three) times daily with meals. 01/18/16   Lattie Haw, MD    omeprazole (PRILOSEC) 20 MG capsule Take 1 capsule (20 mg total) by mouth daily. 11/26/10   Bowen, Scot Jun, DO  oxyCODONE-acetaminophen (ROXICET) 5-325 MG tablet Take 1 tablet by mouth every 4 (four) hours as needed for severe pain. 01/04/16   Lattie Haw, MD  predniSONE (DELTASONE) 20 MG tablet Take one tab by mouth TID for 5 days, then BID for 5 days, then one daily for 3 days. Take with food. 01/18/16   Lattie Haw, MD    Family History Family History  Problem Relation Age of Onset  . Heart attack Father   . Hypertension Father   . Heart failure Father     Social History Social History   Tobacco Use  . Smoking status: Never Smoker  . Smokeless tobacco: Never Used  Substance Use Topics  . Alcohol use: Yes  . Drug use: No     Allergies   Patient has no known allergies.   Review of Systems Review of Systems No sore throat + cough No pleuritic pain, but has pain in anterior chest No wheezing + nasal congestion + post-nasal drainage No sinus pain/pressure No itchy/red eyes No earache No hemoptysis No SOB No fever/chills No nausea No vomiting No abdominal pain No diarrhea No urinary symptoms No skin rash + fatigue No myalgias No headache Used OTC  meds without relief   Physical Exam Triage Vital Signs ED Triage Vitals [03/13/18 0917]  Enc Vitals Group     BP 130/77     Pulse Rate 82     Resp      Temp 97.9 F (36.6 C)     Temp Source Oral     SpO2 98 %     Weight 202 lb (91.6 kg)     Height 5\' 11"  (1.803 m)     Head Circumference      Peak Flow      Pain Score 0     Pain Loc      Pain Edu?      Excl. in GC?    No data found.  Updated Vital Signs BP 130/77 (BP Location: Right Arm)   Pulse 82   Temp 97.9 F (36.6 C) (Oral)   Ht 5\' 11"  (1.803 m)   Wt 91.6 kg   SpO2 98%   BMI 28.17 kg/m   Visual Acuity Right Eye Distance:   Left Eye Distance:   Bilateral Distance:    Right Eye Near:   Left Eye Near:    Bilateral Near:      Physical Exam Nursing notes and Vital Signs reviewed. Appearance:  Patient appears stated age, and in no acute distress Eyes:  Pupils are equal, round, and reactive to light and accomodation.  Extraocular movement is intact.  Conjunctivae are not inflamed  Ears:  Canals normal.  Tympanic membranes normal.  Nose:  Congested turbinates.  No sinus tenderness.   Pharynx:  Normal Neck:  Supple.  No adenopathy. Lungs:  Clear to auscultation.  Breath sounds are equal.  Moving air well.  No chest tenderness to palpation. Heart:  Regular rate and rhythm without murmurs, rubs, or gallops.  Abdomen:  Nontender without masses or hepatosplenomegaly.  Bowel sounds are present.  No CVA or flank tenderness.  Extremities:  No edema.  Skin:  No rash present.    UC Treatments / Results  Labs (all labs ordered are listed, but only abnormal results are displayed) Labs Reviewed - No data to display  EKG None  Radiology Dg Chest 2 View  Result Date: 03/13/2018 CLINICAL DATA:  Persistent, nonproductive cough for the past 1.5 weeks with anterior chest pain. EXAM: CHEST - 2 VIEW COMPARISON:  None. FINDINGS: Normal sized heart. Tortuous aorta. Clear lungs. Minimal peribronchial thickening. Mild thoracic spine degenerative changes. Moderate right glenohumeral degenerative changes. IMPRESSION: Minimal bronchitic changes. Electronically Signed   By: Beckie Salts M.D.   On: 03/13/2018 10:11    Procedures Procedures (including critical care time)  Medications Ordered in UC Medications - No data to display  Initial Impression / Assessment and Plan / UC Course  I have reviewed the triage vital signs and the nursing notes.  Pertinent labs & imaging results that were available during my care of the patient were reviewed by me and considered in my medical decision making (see chart for details).    Begin Z-pak for atypical coverage. Followup with Family Doctor if not improved in about 10 days.   Final  Clinical Impressions(s) / UC Diagnoses   Final diagnoses:  Bronchitis     Discharge Instructions     Take plain guaifenesin (1200mg  extended release tabs such as Mucinex) twice daily, with plenty of water, for cough and congestion.  May add Pseudoephedrine (30mg , one or two every 4 to 6 hours) for sinus congestion.  Get adequate rest.   May use  Afrin nasal spray (or generic oxymetazoline) each morning for about 5 days and then discontinue.  Also recommend using saline nasal spray several times daily and saline nasal irrigation (AYR is a common brand).   Try warm salt water gargles for sore throat.  Stop all antihistamines for now, and other non-prescription cough/cold preparations. May take Delsym Cough Suppressant at bedtime for nighttime cough.  Take prednisone 20mg  as follows:  One tab twice daily for five days, then one daily for three days (take with food).  Recommend a flu shot when well.     ED Prescriptions    Medication Sig Dispense Auth. Provider   azithromycin (ZITHROMAX Z-PAK) 250 MG tablet Take 2 tabs today; then begin one tab once daily for 4 more days. 6 tablet Lattie Haw, MD        Lattie Haw, MD 03/16/18 [phone]

## 2018-05-11 ENCOUNTER — Emergency Department (INDEPENDENT_AMBULATORY_CARE_PROVIDER_SITE_OTHER): Payer: BLUE CROSS/BLUE SHIELD

## 2018-05-11 ENCOUNTER — Emergency Department
Admission: EM | Admit: 2018-05-11 | Discharge: 2018-05-11 | Disposition: A | Discharge status: Home / Self Care | Payer: BLUE CROSS/BLUE SHIELD | Source: Home / Self Care | Attending: Family Medicine | Admitting: Family Medicine

## 2018-05-11 ENCOUNTER — Other Ambulatory Visit: Payer: Self-pay

## 2018-05-11 DIAGNOSIS — R05 Cough: Secondary | ICD-10-CM | POA: Diagnosis not present

## 2018-05-11 DIAGNOSIS — R590 Localized enlarged lymph nodes: Secondary | ICD-10-CM

## 2018-05-11 DIAGNOSIS — R0989 Other specified symptoms and signs involving the circulatory and respiratory systems: Secondary | ICD-10-CM

## 2018-05-11 DIAGNOSIS — R053 Chronic cough: Secondary | ICD-10-CM

## 2018-05-11 LAB — POCT CBC W AUTO DIFF (K'VILLE URGENT CARE)

## 2018-05-11 NOTE — Discharge Instructions (Signed)
°  Please use resource guide to help establish care with a primary care provider for ongoing healthcare needs including recheck of symptoms.
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
## 2018-05-11 NOTE — ED Triage Notes (Signed)
Pt states that for the last 3 weeks has had lymph node sensitivity under both arms and down into chest.  Both sides.
